# Patient Record
Sex: Female | Born: 1950 | Race: White | Hispanic: No | Marital: Married | Smoking: Never smoker
Health system: Southern US, Community
[De-identification: ages and names within clinical notes are randomized; demographics above are authoritative.]

## PROBLEM LIST (undated history)

## (undated) DIAGNOSIS — N393 Stress incontinence (female) (male): Secondary | ICD-10-CM

## (undated) DIAGNOSIS — N952 Postmenopausal atrophic vaginitis: Secondary | ICD-10-CM

## (undated) DIAGNOSIS — R112 Nausea with vomiting, unspecified: Secondary | ICD-10-CM

## (undated) DIAGNOSIS — E7800 Pure hypercholesterolemia, unspecified: Secondary | ICD-10-CM

## (undated) DIAGNOSIS — IMO0001 Reserved for inherently not codable concepts without codable children: Secondary | ICD-10-CM

## (undated) DIAGNOSIS — K2960 Other gastritis without bleeding: Secondary | ICD-10-CM

## (undated) DIAGNOSIS — Z9889 Other specified postprocedural states: Secondary | ICD-10-CM

## (undated) DIAGNOSIS — Z87440 Personal history of urinary (tract) infections: Secondary | ICD-10-CM

## (undated) DIAGNOSIS — E559 Vitamin D deficiency, unspecified: Secondary | ICD-10-CM

## (undated) DIAGNOSIS — D131 Benign neoplasm of stomach: Secondary | ICD-10-CM

## (undated) DIAGNOSIS — N3281 Overactive bladder: Secondary | ICD-10-CM

## (undated) DIAGNOSIS — H8109 Meniere's disease, unspecified ear: Secondary | ICD-10-CM

## (undated) DIAGNOSIS — R7303 Prediabetes: Secondary | ICD-10-CM

## (undated) DIAGNOSIS — H5500 Unspecified nystagmus: Secondary | ICD-10-CM

## (undated) DIAGNOSIS — N200 Calculus of kidney: Secondary | ICD-10-CM

## (undated) DIAGNOSIS — K219 Gastro-esophageal reflux disease without esophagitis: Secondary | ICD-10-CM

## (undated) DIAGNOSIS — M1990 Unspecified osteoarthritis, unspecified site: Secondary | ICD-10-CM

## (undated) DIAGNOSIS — E079 Disorder of thyroid, unspecified: Secondary | ICD-10-CM

## (undated) DIAGNOSIS — R42 Dizziness and giddiness: Secondary | ICD-10-CM

## (undated) HISTORY — PX: TONSILLECTOMY: SUR1361

## (undated) HISTORY — DX: Reserved for inherently not codable concepts without codable children: IMO0001

## (undated) HISTORY — DX: Meniere's disease, unspecified ear: H81.09

## (undated) HISTORY — DX: Overactive bladder: N32.81

## (undated) HISTORY — DX: Personal history of urinary (tract) infections: Z87.440

## (undated) HISTORY — DX: Postmenopausal atrophic vaginitis: N95.2

## (undated) HISTORY — PX: TUBAL LIGATION: SHX77

## (undated) HISTORY — DX: Stress incontinence (female) (male): N39.3

## (undated) HISTORY — DX: Calculus of kidney: N20.0

## (undated) HISTORY — DX: Vitamin D deficiency, unspecified: E55.9

## (undated) HISTORY — PX: OTHER SURGICAL HISTORY: SHX169

## (undated) HISTORY — PX: HIP ARTHROPLASTY: SHX981

---

## 1968-11-03 HISTORY — PX: BREAST EXCISIONAL BIOPSY: SUR124

## 2000-11-03 HISTORY — PX: ABDOMINAL HYSTERECTOMY: SHX81

## 2005-02-26 ENCOUNTER — Ambulatory Visit: Payer: Self-pay | Admitting: General Surgery

## 2005-03-20 ENCOUNTER — Ambulatory Visit: Payer: Self-pay | Admitting: General Surgery

## 2005-03-27 ENCOUNTER — Ambulatory Visit: Payer: Self-pay | Admitting: General Surgery

## 2006-06-18 ENCOUNTER — Ambulatory Visit: Payer: Self-pay | Admitting: Unknown Physician Specialty

## 2007-10-12 ENCOUNTER — Ambulatory Visit: Payer: Self-pay | Admitting: Internal Medicine

## 2007-11-04 HISTORY — PX: BREAST BIOPSY: SHX20

## 2008-08-14 ENCOUNTER — Ambulatory Visit: Payer: Self-pay | Admitting: Urology

## 2008-12-13 ENCOUNTER — Ambulatory Visit: Payer: Self-pay | Admitting: Internal Medicine

## 2009-12-18 ENCOUNTER — Ambulatory Visit: Payer: Self-pay | Admitting: Internal Medicine

## 2011-02-10 ENCOUNTER — Ambulatory Visit: Payer: Self-pay | Admitting: Internal Medicine

## 2012-04-13 ENCOUNTER — Ambulatory Visit: Payer: Self-pay | Admitting: Family Medicine

## 2012-04-15 ENCOUNTER — Ambulatory Visit: Payer: Self-pay | Admitting: Family Medicine

## 2012-06-30 ENCOUNTER — Ambulatory Visit: Payer: Self-pay | Admitting: Unknown Physician Specialty

## 2012-06-30 LAB — CREATININE, SERUM
EGFR (African American): >60
EGFR (Non-African Amer.): >60

## 2012-10-14 ENCOUNTER — Ambulatory Visit: Payer: Self-pay | Admitting: Gastroenterology

## 2012-10-18 ENCOUNTER — Ambulatory Visit: Payer: Self-pay | Admitting: Family Medicine

## 2013-03-30 DIAGNOSIS — M25559 Pain in unspecified hip: Secondary | ICD-10-CM | POA: Insufficient documentation

## 2013-03-30 DIAGNOSIS — M1610 Unilateral primary osteoarthritis, unspecified hip: Secondary | ICD-10-CM | POA: Insufficient documentation

## 2013-04-13 ENCOUNTER — Ambulatory Visit: Payer: Self-pay

## 2013-05-10 ENCOUNTER — Ambulatory Visit: Payer: Self-pay | Admitting: Family Medicine

## 2013-06-03 HISTORY — PX: JOINT REPLACEMENT: SHX530

## 2013-06-16 DIAGNOSIS — E039 Hypothyroidism, unspecified: Secondary | ICD-10-CM | POA: Insufficient documentation

## 2013-06-17 DIAGNOSIS — Z87440 Personal history of urinary (tract) infections: Secondary | ICD-10-CM | POA: Insufficient documentation

## 2014-03-01 ENCOUNTER — Ambulatory Visit: Payer: Self-pay | Admitting: Urology

## 2014-06-26 ENCOUNTER — Ambulatory Visit: Payer: Self-pay | Admitting: Family Medicine

## 2015-06-17 ENCOUNTER — Emergency Department
Admission: EM | Admit: 2015-06-17 | Discharge: 2015-06-18 | Disposition: A | Discharge status: Home / Self Care | Payer: Medicare Other | Attending: Emergency Medicine | Admitting: Emergency Medicine

## 2015-06-17 ENCOUNTER — Encounter: Payer: Self-pay | Admitting: *Deleted

## 2015-06-17 DIAGNOSIS — R42 Dizziness and giddiness: Secondary | ICD-10-CM | POA: Insufficient documentation

## 2015-06-17 DIAGNOSIS — H5500 Unspecified nystagmus: Secondary | ICD-10-CM | POA: Insufficient documentation

## 2015-06-17 DIAGNOSIS — Z79899 Other long term (current) drug therapy: Secondary | ICD-10-CM | POA: Insufficient documentation

## 2015-06-17 DIAGNOSIS — R112 Nausea with vomiting, unspecified: Secondary | ICD-10-CM | POA: Insufficient documentation

## 2015-06-17 HISTORY — DX: Dizziness and giddiness: R42

## 2015-06-17 HISTORY — DX: Unspecified nystagmus: H55.00

## 2015-06-17 LAB — COMPREHENSIVE METABOLIC PANEL
ALBUMIN: 4.3 g/dL (ref 3.5–5.0)
ALT: 59 U/L — AB (ref 14–54)
ANION GAP: 11 (ref 5–15)
AST: 46 U/L — ABNORMAL HIGH (ref 15–41)
Alkaline Phosphatase: 94 U/L (ref 38–126)
BILIRUBIN TOTAL: 0.4 mg/dL (ref 0.3–1.2)
BUN: 17 mg/dL (ref 6–20)
CO2: 27 mmol/L (ref 22–32)
CREATININE: 0.71 mg/dL (ref 0.44–1.00)
Calcium: 9.6 mg/dL (ref 8.9–10.3)
Chloride: 101 mmol/L (ref 101–111)
GLUCOSE: 159 mg/dL — AB (ref 65–99)
Potassium: 3.2 mmol/L — ABNORMAL LOW (ref 3.5–5.1)
Sodium: 139 mmol/L (ref 135–145)
TOTAL PROTEIN: 8.1 g/dL (ref 6.5–8.1)

## 2015-06-17 LAB — CBC
HEMATOCRIT: 38.2 % (ref 35.0–47.0)
Hemoglobin: 12.9 g/dL (ref 12.0–16.0)
MCH: 29.5 pg (ref 26.0–34.0)
MCHC: 33.8 g/dL (ref 32.0–36.0)
MCV: 87.3 fL (ref 80.0–100.0)
Platelets: 333 10*3/uL (ref 150–440)
RBC: 4.38 MIL/uL (ref 3.80–5.20)
RDW: 12.7 % (ref 11.5–14.5)
WBC: 8.9 10*3/uL (ref 3.6–11.0)

## 2015-06-17 MED ORDER — METOCLOPRAMIDE HCL 5 MG/ML IJ SOLN
INTRAMUSCULAR | Status: AC
  Administered 2015-06-17: 10 mg via INTRAVENOUS
  Filled 2015-06-17: qty 2

## 2015-06-17 MED ORDER — METOCLOPRAMIDE HCL 5 MG/ML IJ SOLN
10.0000 mg | Freq: Once | INTRAMUSCULAR | Status: AC
  Administered 2015-06-17: 10 mg via INTRAVENOUS

## 2015-06-17 MED ORDER — ONDANSETRON HCL 4 MG/2ML IJ SOLN: 4.0000 mg | Freq: Once | INTRAMUSCULAR | Status: DC | PRN

## 2015-06-17 MED ORDER — SODIUM CHLORIDE 0.9 % IV BOLUS (SEPSIS)
1000.0000 mL | Freq: Once | INTRAVENOUS | Status: AC
  Administered 2015-06-17: 1000 mL via INTRAVENOUS

## 2015-06-17 MED ORDER — DIAZEPAM 5 MG/ML IJ SOLN
2.5000 mg | Freq: Once | INTRAMUSCULAR | Status: AC
  Administered 2015-06-17: 2.5 mg via INTRAVENOUS
  Filled 2015-06-17: qty 2

## 2015-06-17 NOTE — ED Notes (Signed)
Family states possible infection of the labriniyth, possible Mine[place]

## 2015-06-17 NOTE — ED Notes (Signed)
Pt presents w/ c/o extreme, sudden onset nausea. Pt's husband states she has an inner ear infection. Pt denies pain in ears. Pt is dry heaving in triage and vomited several times in the past hour since onset at home. Pt has taken no medications for nausea.

## 2015-06-17 NOTE — ED Provider Notes (Signed)
[facility] Emergency Department Provider Note   ____________________________________________  Time seen: 2127  I have reviewed the triage vital signs and the nursing notes.   HISTORY  Chief Complaint Nausea   History limited by: Not Limited   HPI Susan Harding is a 64 y.o. female who presents to the emergency department today with fairly acute onset of dizziness, nausea and vomiting. 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 This however was completely different. The patient states that within roughly 30 minute time period she developed intense nausea, vomiting. She stated that slight movements would set it off and that it would persist. She has never had symptoms as intense or similar to this in the past. She contacted her ENT who recommended that she be seen in the emergency department and given antiemetics as well as benzodiazepines. No recent colds.  Past Medical History  Diagnosis Date  . Vertigo   . Nystagmus     There are no active problems to display for this patient.   Past Surgical History  Procedure Laterality Date  . Joint replacement    . Abdominal hysterectomy      Current Outpatient Rx  Name  Route  Sig  Dispense  Refill  . levothyroxine (SYNTHROID, LEVOTHROID) 100 MCG tablet   Oral   Take 100 mcg by mouth daily before breakfast.         . montelukast (SINGULAIR) 10 MG tablet   Oral   Take 10 mg by mouth at bedtime.         . triamterene-hydrochlorothiazide (DYAZIDE) 37.5-25 MG per capsule   Oral   Take 1 capsule by mouth daily.           Allergies Review of patient's allergies indicates no known allergies.  History reviewed. No pertinent family history.  Social History Social History  Substance Use Topics  . Smoking status: Never Smoker   . Smokeless tobacco: Never Used  . Alcohol Use: No    Review of Systems  Constitutional: Negative for fever. Cardiovascular: Negative for chest pain. Respiratory:  Negative for shortness of breath. Gastrointestinal: Positive for nausea and vomiting Neurological: Positive for dizziness.  10-point ROS otherwise negative.  ____________________________________________   PHYSICAL EXAM:  VITAL SIGNS: ED Triage Vitals  Enc Vitals Group     BP 06/17/15 2105 127/71 mmHg     Pulse Rate 06/17/15 2105 67     Resp 06/17/15 2105 20     Temp 06/17/15 2105 97.9 F (36.6 C)     Temp Source 06/17/15 2105 Oral     SpO2 06/17/15 2105 96 %     Weight 06/17/15 2105 165 lb (74.844 kg)     Height 06/17/15 2105 5\' 2"  (1.575 m)   Constitutional: Alert and oriented. Appears uncomfortable. Eyes: Conjunctivae are normal. PERRL. bilateral nystagmus. ENT   Head: Normocephalic and atraumatic.   Nose: No congestion/rhinnorhea.   Mouth/Throat: Mucous membranes are moist.   Neck: No stridor. Hematological/Lymphatic/Immunilogical: No cervical lymphadenopathy. Cardiovascular: Normal rate, regular rhythm.  No murmurs, rubs, or gallops. Respiratory: Normal respiratory effort without tachypnea nor retractions. Breath sounds are clear and equal bilaterally. No wheezes/rales/rhonchi. Genitourinary: Deferred Musculoskeletal: Normal range of motion in all extremities. No joint effusions.  No lower extremity tenderness nor edema. Neurologic:  Normal speech and language. Bilateral nystagmus. Extraocular motions intact. No gross focal neurologic deficits are appreciated. Speech is normal.  Skin:  Skin is warm, dry and intact. No rash noted. Psychiatric: Mood and affect are normal. Speech  and behavior are normal. Patient exhibits appropriate insight and judgment.  ____________________________________________    LABS (pertinent positives/negatives)  Labs Reviewed  COMPREHENSIVE METABOLIC PANEL - Abnormal; Notable for the following:    Potassium 3.2 (*)    Glucose, Bld 159 (*)    AST 46 (*)    ALT 59 (*)    All other components within normal limits  CBC      ____________________________________________   EKG  None  ____________________________________________    RADIOLOGY  None  ____________________________________________   PROCEDURES  Procedure(s) performed: None  Critical Care performed: No  ____________________________________________   INITIAL IMPRESSION / ASSESSMENT AND PLAN / ED COURSE  Pertinent labs & imaging results that were available during my care of the patient were reviewed by me and considered in my medical decision making (see chart for details).  Patient presented to the emergency department today because of concerns for somewhat acute onset of nausea and vomiting and dizziness. On exam patient does have some bilateral nystagmus and does appear uncomfortable with dry heaving. I discussed with Dr. Kathyrn Sheriff with ENT who believes this is likely acute labyrinthitis. Will give both antiemetics and benzo and reassess.  Patient did feel better after medication. Was able to tolerate getting up and walking around. Will plan on discharging home with medications and ENT follow up.  ____________________________________________   FINAL CLINICAL IMPRESSION(S) / ED DIAGNOSES  Final diagnoses:  Dizziness  Nausea and vomiting, vomiting of unspecified type     Nance Pear, MD 06/18/15 [phone]

## 2015-06-18 MED ORDER — DIAZEPAM 5 MG PO TABS: 5.0000 mg | ORAL_TABLET | Freq: Three times a day (TID) | ORAL | Status: AC | PRN

## 2015-06-18 MED ORDER — METOCLOPRAMIDE HCL 10 MG PO TABS: 10.0000 mg | ORAL_TABLET | Freq: Four times a day (QID) | ORAL | Status: DC | PRN

## 2015-06-18 NOTE — ED Notes (Signed)
Pt. Going home with husband. 

## 2015-06-18 NOTE — Discharge Instructions (Signed)
Please follow up with Dr. Kathyrn Sheriff. Please seek medical attention for any high fevers, chest pain, shortness of breath, change in behavior, persistent vomiting, bloody stool or any other new or concerning symptoms.  Labyrinthitis (Inner Ear Inflammation) Your exam shows you have an inner ear disturbance or labyrinthitis. The cause of this condition is not known. But it may be due to a virus infection. The symptoms of labyrinthitis include vertigo or dizziness made worse by motion, nausea and vomiting. The onset of labyrinthitis may be very sudden. It usually lasts for a few days and then clears up over 1-2 weeks. The treatment of an inner ear disturbance includes bed rest and medications to reduce dizziness, nausea, and vomiting. You should stay away from alcohol, tranquilizers, caffeine, nicotine, or any medicine your doctor thinks may make your symptoms worse. Further testing may be needed to evaluate your hearing and balance system. Please see your doctor or go to the emergency room right away if you have:  Increasing vertigo, earache, loss of hearing, or ear drainage.  Headache, blurred vision, trouble walking, fainting, or fever.  Persistent vomiting, dehydration, or extreme weakness. Document Released: 10/20/2005 Document Revised: 01/12/2012 Document Reviewed: 04/07/2007 [facility] Patient Information 2015 [place], [place]. This information is not intended to replace advice given to you by your health care provider. Make sure you discuss any questions you have with your health care provider.

## 2015-08-16 ENCOUNTER — Encounter: Payer: Self-pay | Admitting: *Deleted

## 2015-08-28 ENCOUNTER — Ambulatory Visit: Payer: Self-pay | Admitting: Urology

## 2015-08-29 ENCOUNTER — Ambulatory Visit (INDEPENDENT_AMBULATORY_CARE_PROVIDER_SITE_OTHER): Payer: Medicare Other | Admitting: Urology

## 2015-08-29 ENCOUNTER — Encounter: Payer: Self-pay | Admitting: Urology

## 2015-08-29 ENCOUNTER — Telehealth: Payer: Self-pay | Admitting: Urology

## 2015-08-29 VITALS — BP 120/80 | HR 71 | Ht 62.0 in | Wt 170.6 lb

## 2015-08-29 DIAGNOSIS — N952 Postmenopausal atrophic vaginitis: Secondary | ICD-10-CM | POA: Diagnosis not present

## 2015-08-29 DIAGNOSIS — N3281 Overactive bladder: Secondary | ICD-10-CM

## 2015-08-29 LAB — MICROSCOPIC EXAMINATION
Bacteria, UA: NONE SEEN
RENAL EPITHEL UA: NONE SEEN /HPF

## 2015-08-29 LAB — URINALYSIS, COMPLETE
BILIRUBIN UA: NEGATIVE
Glucose, UA: NEGATIVE
KETONES UA: NEGATIVE
NITRITE UA: NEGATIVE
Protein, UA: NEGATIVE
SPEC GRAV UA: 1.015 (ref 1.005–1.030)
UUROB: 0.2 mg/dL (ref 0.2–1.0)
pH, UA: 6.5 (ref 5.0–7.5)

## 2015-08-29 NOTE — Progress Notes (Signed)
08/29/2015 10:16 AM   Susan Harding 1951/07/31 [id number]  Referring provider: No referring provider defined for this encounter.  CC:   OAB follow up  HPI: Patient is a 64 year old white female with urinary urgency and  has atrophic vaginitis who presents today for her 1 year follow-up.  Patient has completely eliminated caffeine from her diet and her urinary symptoms have completely abated.  Her UA today is unremarkable.  She is not had any urinary tract infections of the last year. She denies any dysuria, suprapubic pain or hematuria. She also has not had any fevers, chills, nausea or vomiting.  She is using her vaginal estrogen cream intermittently. She is having some frictional pain during intercourse, but this is not a frequent issue.  She would like a refill for the cream sent to the compounding pharmacy.  PMH: Past Medical History  Diagnosis Date  . Vertigo   . Nystagmus   . Renal stones   . Atrophic vaginitis   . Frequency   . OAB (overactive bladder)   . History of bladder infections   . Stress incontinence   . Vitamin D deficiency   . Meniere disease     Surgical History: Past Surgical History  Procedure Laterality Date  . Joint replacement  06/2013    hip  . Abdominal hysterectomy  2002    Home Medications:    Medication List       This list is accurate as of: 08/29/15 10:16 AM.  Always use your most recent med list.               diazepam 5 MG tablet  Commonly known as:  VALIUM  Take 1 tablet (5 mg total) by mouth every 8 (eight) hours as needed (Dizziness/Vertigo).     levothyroxine 100 MCG tablet  Commonly known as:  SYNTHROID, LEVOTHROID  Take 100 mcg by mouth daily before breakfast.     metoCLOPramide 10 MG tablet  Commonly known as:  REGLAN  Take 1 tablet (10 mg total) by mouth every 6 (six) hours as needed for nausea or vomiting.     montelukast 10 MG tablet  Commonly known as:  SINGULAIR  Take 10 mg by mouth at bedtime.     triamterene-hydrochlorothiazide 37.5-25 MG capsule  Commonly known as:  DYAZIDE  Take 1 capsule by mouth daily.        Allergies: No Known Allergies  Family History: Family History  Problem Relation Age of Onset  . Interstitial cystitis Daughter   . Kidney disease Father   . Prostate cancer Neg Hx     Social History:  reports that she has never smoked. She has never used smokeless tobacco. She reports that she does not drink alcohol or use illicit drugs.  ROS: UROLOGY Frequent Urination?: No Hard to postpone urination?: No Burning/pain with urination?: No Get up at night to urinate?: No Leakage of urine?: No Urine stream starts and stops?: No Trouble starting stream?: No Do you have to strain to urinate?: No Blood in urine?: No Urinary tract infection?: No Sexually transmitted disease?: No Injury to kidneys or bladder?: No Painful intercourse?: No Weak stream?: No Currently pregnant?: No Vaginal bleeding?: No Last menstrual period?: n  Gastrointestinal Nausea?: No Vomiting?: No Indigestion/heartburn?: No Diarrhea?: No Constipation?: No  Constitutional Fever: No Night sweats?: No Weight loss?: No Fatigue?: No  Skin Skin rash/lesions?: No Itching?: No  Eyes Blurred vision?: No Double vision?: No  Ears/Nose/Throat Sore throat?: No Sinus problems?: No  Hematologic/Lymphatic Swollen glands?: No Easy bruising?: No  Cardiovascular Leg swelling?: No Chest pain?: No  Respiratory Cough?: No Shortness of breath?: No  Endocrine Excessive thirst?: No  Musculoskeletal Back pain?: No Joint pain?: No  Neurological Headaches?: No Dizziness?: No  Psychologic Depression?: No Anxiety?: No  Physical Exam: BP 120/80 mmHg  Pulse 71  Ht 5\' 2"  (1.575 m)  Wt 170 lb 9.6 oz (77.384 kg)  BMI 31.20 kg/m2   Laboratory Data: Lab Results  Component Value Date   WBC 8.9 06/17/2015   HGB 12.9 06/17/2015   HCT 38.2 06/17/2015   MCV 87.3 06/17/2015     PLT 333 06/17/2015    Lab Results  Component Value Date   CREATININE 0.71 06/17/2015    Urinalysis Results for orders placed or performed in visit on 08/29/15  Microscopic Examination  Result Value Ref Range   WBC, UA 0-5 0 -  5 /hpf   RBC, UA 0-2 0 -  2 /hpf   Epithelial Cells (non renal) 0-10 0 - 10 /hpf   Renal Epithel, UA None seen None seen /hpf   Bacteria, UA None seen None seen/Few  Urinalysis, Complete  Result Value Ref Range   Specific Gravity, UA 1.015 1.005 - 1.030   pH, UA 6.5 5.0 - 7.5   Color, UA Yellow Yellow   Appearance Ur Clear Clear   Leukocytes, UA Trace (A) Negative   Protein, UA Negative Negative/Trace   Glucose, UA Negative Negative   Ketones, UA Negative Negative   RBC, UA Trace (A) Negative   Bilirubin, UA Negative Negative   Urobilinogen, Ur 0.2 0.2 - 1.0 mg/dL   Nitrite, UA Negative Negative   Microscopic Examination See below:      Assessment & Plan:    1. OAB (overactive bladder):    Patient is found that eliminated caffeine completely from her diet completely relieves her of her overactive bladder symptoms and urinary tract infections.  She states that avoiding caffeine-containing products is worth not having her overactive bladder symptoms.  Her UA today is negative.  Patient would like to continue her care with her primary care physician. She will contact our office if her urinary symptoms should return.  - Urinalysis, Complete  2. Atrophic vaginitis:   A refill for the compounded vaginal estrogen cream is sent to [facility].  She will ask her primary care physician if he feels comfortable refilling her vaginal estrogen cream.     Return if symptoms worsen or fail to improve.  Zara Council, [facility] [address] [place], [place] [address] [phone]

## 2015-08-29 NOTE — Telephone Encounter (Signed)
Would you call in the compounded vaginal estrogen cream to [facility]?

## 2015-08-30 NOTE — Telephone Encounter (Signed)
Medication called [facility].

## 2015-10-12 ENCOUNTER — Other Ambulatory Visit: Payer: Self-pay | Admitting: Gastroenterology

## 2015-10-12 DIAGNOSIS — R1310 Dysphagia, unspecified: Secondary | ICD-10-CM

## 2015-10-12 DIAGNOSIS — R1013 Epigastric pain: Secondary | ICD-10-CM

## 2015-10-19 ENCOUNTER — Ambulatory Visit
Admission: RE | Admit: 2015-10-19 | Discharge: 2015-10-19 | Disposition: A | Discharge status: Home / Self Care | Payer: Medicare Other | Source: Ambulatory Visit | Attending: Gastroenterology | Admitting: Gastroenterology

## 2015-10-19 DIAGNOSIS — R1013 Epigastric pain: Secondary | ICD-10-CM | POA: Insufficient documentation

## 2015-10-19 DIAGNOSIS — N281 Cyst of kidney, acquired: Secondary | ICD-10-CM | POA: Diagnosis not present

## 2015-10-19 DIAGNOSIS — R1310 Dysphagia, unspecified: Secondary | ICD-10-CM

## 2015-10-24 ENCOUNTER — Other Ambulatory Visit: Payer: Self-pay | Admitting: Gastroenterology

## 2015-10-24 DIAGNOSIS — N281 Cyst of kidney, acquired: Secondary | ICD-10-CM

## 2015-10-24 DIAGNOSIS — K769 Liver disease, unspecified: Secondary | ICD-10-CM

## 2015-11-15 ENCOUNTER — Ambulatory Visit
Admission: RE | Admit: 2015-11-15 | Discharge: 2015-11-15 | Disposition: A | Discharge status: Home / Self Care | Payer: Medicare Other | Source: Ambulatory Visit | Attending: Gastroenterology | Admitting: Gastroenterology

## 2015-11-15 ENCOUNTER — Encounter: Payer: Self-pay | Admitting: *Deleted

## 2015-11-15 DIAGNOSIS — K769 Liver disease, unspecified: Secondary | ICD-10-CM

## 2015-11-15 DIAGNOSIS — N281 Cyst of kidney, acquired: Secondary | ICD-10-CM | POA: Diagnosis present

## 2015-11-15 DIAGNOSIS — K7689 Other specified diseases of liver: Secondary | ICD-10-CM | POA: Insufficient documentation

## 2015-11-15 DIAGNOSIS — K824 Cholesterolosis of gallbladder: Secondary | ICD-10-CM | POA: Insufficient documentation

## 2015-11-15 MED ORDER — GADOBENATE DIMEGLUMINE 529 MG/ML IV SOLN
15.0000 mL | Freq: Once | INTRAVENOUS | Status: AC | PRN
  Administered 2015-11-15: 15 mL via INTRAVENOUS

## 2015-11-16 ENCOUNTER — Ambulatory Visit: Payer: Medicare Other | Admitting: Anesthesiology

## 2015-11-16 ENCOUNTER — Encounter
Admission: RE | Disposition: A | Discharge status: Home / Self Care | Payer: Self-pay | Source: Ambulatory Visit | Attending: Gastroenterology

## 2015-11-16 ENCOUNTER — Ambulatory Visit
Admission: RE | Admit: 2015-11-16 | Discharge: 2015-11-16 | Disposition: A | Discharge status: Home / Self Care | Payer: Medicare Other | Source: Ambulatory Visit | Attending: Gastroenterology | Admitting: Gastroenterology

## 2015-11-16 DIAGNOSIS — K2950 Unspecified chronic gastritis without bleeding: Secondary | ICD-10-CM | POA: Diagnosis not present

## 2015-11-16 DIAGNOSIS — E559 Vitamin D deficiency, unspecified: Secondary | ICD-10-CM | POA: Diagnosis not present

## 2015-11-16 DIAGNOSIS — K2980 Duodenitis without bleeding: Secondary | ICD-10-CM | POA: Diagnosis not present

## 2015-11-16 DIAGNOSIS — E119 Type 2 diabetes mellitus without complications: Secondary | ICD-10-CM | POA: Insufficient documentation

## 2015-11-16 DIAGNOSIS — G709 Myoneural disorder, unspecified: Secondary | ICD-10-CM | POA: Diagnosis not present

## 2015-11-16 DIAGNOSIS — R1310 Dysphagia, unspecified: Secondary | ICD-10-CM | POA: Diagnosis not present

## 2015-11-16 DIAGNOSIS — K317 Polyp of stomach and duodenum: Secondary | ICD-10-CM | POA: Diagnosis not present

## 2015-11-16 DIAGNOSIS — K228 Other specified diseases of esophagus: Secondary | ICD-10-CM | POA: Diagnosis not present

## 2015-11-16 DIAGNOSIS — Z79899 Other long term (current) drug therapy: Secondary | ICD-10-CM | POA: Insufficient documentation

## 2015-11-16 DIAGNOSIS — Z885 Allergy status to narcotic agent status: Secondary | ICD-10-CM | POA: Insufficient documentation

## 2015-11-16 DIAGNOSIS — H8109 Meniere's disease, unspecified ear: Secondary | ICD-10-CM | POA: Insufficient documentation

## 2015-11-16 DIAGNOSIS — R1013 Epigastric pain: Secondary | ICD-10-CM | POA: Insufficient documentation

## 2015-11-16 DIAGNOSIS — K2960 Other gastritis without bleeding: Secondary | ICD-10-CM | POA: Diagnosis not present

## 2015-11-16 HISTORY — DX: Unspecified osteoarthritis, unspecified site: M19.90

## 2015-11-16 HISTORY — PX: ESOPHAGOGASTRODUODENOSCOPY: SHX5428

## 2015-11-16 SURGERY — EGD (ESOPHAGOGASTRODUODENOSCOPY)
Anesthesia: General

## 2015-11-16 MED ORDER — PROPOFOL 500 MG/50ML IV EMUL
INTRAVENOUS | Status: DC | PRN
  Administered 2015-11-16: 120 ug/kg/min via INTRAVENOUS

## 2015-11-16 MED ORDER — SODIUM CHLORIDE 0.9 % IV SOLN: INTRAVENOUS | Status: DC

## 2015-11-16 MED ORDER — MIDAZOLAM HCL 2 MG/2ML IJ SOLN
INTRAMUSCULAR | Status: DC | PRN
  Administered 2015-11-16 (×2): 1 mg via INTRAVENOUS

## 2015-11-16 MED ORDER — SODIUM CHLORIDE 0.9 % IV SOLN
INTRAVENOUS | Status: DC
  Administered 2015-11-16: 1000 mL via INTRAVENOUS

## 2015-11-16 MED ORDER — AMPICILLIN SODIUM 2 G IJ SOLR
2.0000 g | Freq: Once | INTRAMUSCULAR | Status: AC
  Administered 2015-11-16: 2 g via INTRAVENOUS
  Filled 2015-11-16: qty 2000

## 2015-11-16 MED ORDER — FENTANYL CITRATE (PF) 100 MCG/2ML IJ SOLN
INTRAMUSCULAR | Status: DC | PRN
  Administered 2015-11-16 (×2): 25 ug via INTRAVENOUS

## 2015-11-16 MED ORDER — PROPOFOL 10 MG/ML IV BOLUS
INTRAVENOUS | Status: DC | PRN
  Administered 2015-11-16 (×2): 20 mg via INTRAVENOUS

## 2015-11-16 MED ORDER — GLYCOPYRROLATE 0.2 MG/ML IJ SOLN
INTRAMUSCULAR | Status: DC | PRN
  Administered 2015-11-16: 0.2 mg via INTRAVENOUS

## 2015-11-16 MED ORDER — LIDOCAINE HCL (CARDIAC) 20 MG/ML IV SOLN
INTRAVENOUS | Status: DC | PRN
  Administered 2015-11-16: 100 mg via INTRAVENOUS

## 2015-11-16 NOTE — Op Note (Signed)
[facility] Gastroenterology Patient Name: Susan Harding Procedure Date: 11/16/2015 8:59 AM MRN: [id number] Account #: [id number] Date of Birth: 1950-12-21 Admit Type: Outpatient Age: 65 Room: [facility] ENDO ROOM 3 Gender: Female Note Status: Finalized Procedure:         Upper GI endoscopy Indications:       Epigastric abdominal pain, Dysphagia Providers:         Lollie Sails, MD Referring MD:      Dion Body (Referring MD) Medicines:         Monitored Anesthesia Care Complications:     No immediate complications. Procedure:         Pre-Anesthesia Assessment:                    - ASA Grade Assessment: II - A patient with mild systemic                     disease.                    After obtaining informed consent, the endoscope was passed                     under direct vision. Throughout the procedure, the                     patient's blood pressure, pulse, and oxygen saturations                     were monitored continuously. The Endoscope was introduced                     through the mouth, and advanced to the third part of                     duodenum. The upper GI endoscopy was accomplished without                     difficulty. The patient tolerated the procedure well. Findings:      The Z-line was irregular. Biopsies were taken with a cold forceps for       histology.      The exam of the esophagus was otherwise normal.      Patchy mild inflammation characterized by congestion (edema), erosions       and granularity was found at the incisura, in the gastric antrum and in       the prepyloric region of the stomach. Biopsies were taken with a cold       forceps for histology. Biopsies were taken with a cold forceps for       Helicobacter pylori testing.      The cardia and gastric fundus were normal on retroflexion.      A single 1 mm sessile polyp with no bleeding and no stigmata of recent       bleeding was found in the gastric body. The  polyp was removed with a       cold biopsy forceps. Resection and retrieval were complete.      The cardia and gastric fundus were normal on retroflexion.      Patchy mild inflammation characterized by granularity was found in the       duodenal bulb. Biopsies were taken with a cold forceps for histology. Impression:        - Z-line irregular. Biopsied.                    -  Erosive gastritis. Biopsied.                    - A single gastric polyp. Resected and retrieved.                    - Duodenitis. Biopsied. Recommendation:    - Await pathology results.                    - Use Prilosec (omeprazole) 20 mg PO BID for 1 month.                    - Use Prilosec (omeprazole) 20 mg PO daily daily. Procedure Code(s): --- Professional ---                    [phone], Esophagogastroduodenoscopy, flexible, transoral;                     with biopsy, single or multiple Diagnosis Code(s): --- Professional ---                    K22.8, Other specified diseases of esophagus                    K29.60, Other gastritis without bleeding                    K31.7, Polyp of stomach and duodenum                    K29.80, Duodenitis without bleeding                    R10.13, Epigastric pain                    R13.10, Dysphagia, unspecified CPT copyright 2014 American Medical Association. All rights reserved. The codes documented in this report are preliminary and upon coder review may  be revised to meet current compliance requirements. Lollie Sails, MD 11/16/2015 9:25:52 AM This report has been signed electronically. Number of Addenda: 0 Note Initiated On: 11/16/2015 8:59 AM      [facility]

## 2015-11-16 NOTE — H&P (Signed)
Outpatient short stay form Pre-procedure 11/16/2015 8:57 AM Lollie Sails MD  Primary Physician: Dr Barbaraann Cao  Reason for visit:  EGD  History of present illness:  Patient is a 65 year old female presenting today for EGD. Several weeks ago she had some problems with odynophagia and dysphagia with epigastric and distal retrosternal chest discomfort. He was started on some omeprazole currently states she is doing quite well. She had a barium swallow showing a small hiatal hernia. Never been on a proton pump inhibitor before.  She takes no blood thinning medications /aspirin products.    Current facility-administered medications:  .  0.9 %  sodium chloride infusion, , Intravenous, Continuous, Lollie Sails, MD, Last Rate: 20 mL/hr at 11/16/15 0805, 1,000 mL at 11/16/15 0805 .  0.9 %  sodium chloride infusion, , Intravenous, Continuous, Lollie Sails, MD  Prescriptions prior to admission  Medication Sig Dispense Refill Last Dose  . Calcium Carbonate-Vitamin D 600-400 MG-UNIT tablet Take 2 tablets by mouth daily.   Past Week at Unknown time  . diazepam (VALIUM) 5 MG tablet Take 1 tablet (5 mg total) by mouth every 8 (eight) hours as needed (Dizziness/Vertigo). 30 tablet 0 Past Week at Unknown time  . estradiol (ESTRACE) 0.1 MG/GM vaginal cream Place 1 Applicatorful vaginally every 14 (fourteen) days.   Past Week at Unknown time  . Lactobacillus Acidophilus POWD 1 tablet by Does not apply route daily.   Past Week at Unknown time  . levothyroxine (SYNTHROID, LEVOTHROID) 100 MCG tablet Take 100 mcg by mouth daily before breakfast.   11/15/2015 at Unknown time  . Melatonin 10 MG TABS Take 1 tablet by mouth at bedtime.   Past Week at Unknown time  . metoCLOPramide (REGLAN) 10 MG tablet Take 1 tablet (10 mg total) by mouth every 6 (six) hours as needed for nausea or vomiting. 30 tablet 0 Past Week at Unknown time  . montelukast (SINGULAIR) 10 MG tablet Take 10 mg by mouth at bedtime.    Past Week at Unknown time  . OMEGA 3-6-9 FATTY ACIDS PO Take 1 capsule by mouth daily.   Past Week at Unknown time  . omeprazole (PRILOSEC) 20 MG capsule Take 20 mg by mouth 2 (two) times daily before a meal.   Past Week at Unknown time  . triamterene-hydrochlorothiazide (DYAZIDE) 37.5-25 MG per capsule Take 1 capsule by mouth daily.   Past Week at Unknown time  . trimethoprim (TRIMPEX) 100 MG tablet Take 100 mg by mouth daily.   11/15/2015 at Unknown time     Allergies  Allergen Reactions  . Codeine      Past Medical History  Diagnosis Date  . Vertigo   . Nystagmus   . Renal stones   . Atrophic vaginitis   . Frequency   . OAB (overactive bladder)   . History of bladder infections   . Stress incontinence   . Vitamin D deficiency   . Meniere disease   . Arthritis   . Diabetes mellitus without complication ([place])     Borderline Diabetes (diet controlled)    Review of systems:      Physical Exam    Heart and lungs: Regular rate and rhythm without rub, lungs are bilaterally clear.    HEENT: Normal cephalic atraumatic eyes are anicteric    Other:     Pertinant exam for procedure: Soft nontender nondistended bowel sounds positive normoactive.    Planned proceedures: EGD and indicated procedures. I have discussed the risks benefits  and complications of procedures to include not limited to bleeding, infection, perforation and the risk of sedation and the patient wishes to proceed.    Lollie Sails, MD Gastroenterology 11/16/2015  8:57 AM

## 2015-11-16 NOTE — Anesthesia Preprocedure Evaluation (Signed)
Anesthesia Evaluation  Patient identified by MRN, date of birth, ID band Patient awake    Reviewed: Allergy & Precautions, H&P , NPO status , Patient's Chart, lab work & pertinent test results, reviewed documented beta blocker date and time   Airway Mallampati: II  TM Distance: >3 FB Neck ROM: full    Dental no notable dental hx.    Pulmonary neg pulmonary ROS,    Pulmonary exam normal breath sounds clear to auscultation       Cardiovascular Exercise Tolerance: Good negative cardio ROS   Rhythm:regular Rate:Normal     Neuro/Psych  Neuromuscular disease negative neurological ROS  negative psych ROS   GI/Hepatic negative GI ROS, Neg liver ROS,   Endo/Other  negative endocrine ROSdiabetes  Renal/GU negative Renal ROS  negative genitourinary   Musculoskeletal   Abdominal   Peds  Hematology negative hematology ROS (+)   Anesthesia Other Findings   Reproductive/Obstetrics negative OB ROS                             Anesthesia Physical Anesthesia Plan  ASA: II  Anesthesia Plan: General   Post-op Pain Management:    Induction:   Airway Management Planned:   Additional Equipment:   Intra-op Plan:   Post-operative Plan:   Informed Consent: I have reviewed the patients History and Physical, chart, labs and discussed the procedure including the risks, benefits and alternatives for the proposed anesthesia with the patient or authorized representative who has indicated his/her understanding and acceptance.   Dental Advisory Given  Plan Discussed with: CRNA  Anesthesia Plan Comments:         Anesthesia Quick Evaluation

## 2015-11-16 NOTE — Transfer of Care (Signed)
Immediate Anesthesia Transfer of Care Note  Patient: [name]  Procedure(s) Performed: Procedure(s): ESOPHAGOGASTRODUODENOSCOPY (EGD) (N/A)  Patient Location: PACU  Anesthesia Type:General  Level of Consciousness: sedated  Airway & Oxygen Therapy: Patient Spontanous Breathing and Patient connected to nasal cannula oxygen  Post-op Assessment: Report given to RN and Post -op Vital signs reviewed and stable  Post vital signs: Reviewed and stable  Last Vitals:  Filed Vitals:   11/16/15 0749  BP: 127/86  Pulse: 80  Temp: 36.8 C  Resp: 16    Complications: No apparent anesthesia complications

## 2015-11-16 NOTE — Anesthesia Procedure Notes (Signed)
Date/Time: 11/16/2015 9:01 AM Performed by: Johnna Acosta Pre-anesthesia Checklist: Patient identified, Emergency Drugs available, Suction available and Patient being monitored Patient Re-evaluated:Patient Re-evaluated prior to inductionOxygen Delivery Method: Nasal cannula

## 2015-11-18 NOTE — Anesthesia Postprocedure Evaluation (Signed)
Anesthesia Post Note  Patient: Susan Harding  Procedure(s) Performed: Procedure(s) (LRB): ESOPHAGOGASTRODUODENOSCOPY (EGD) (N/A)  Patient location during evaluation: PACU Anesthesia Type: General Level of consciousness: awake and alert Pain management: pain level controlled Vital Signs Assessment: post-procedure vital signs reviewed and stable Respiratory status: spontaneous breathing, nonlabored ventilation, respiratory function stable and patient connected to nasal cannula oxygen Cardiovascular status: blood pressure returned to baseline and stable Postop Assessment: no signs of nausea or vomiting Anesthetic complications: no    Last Vitals:  Filed Vitals:   11/16/15 0950 11/16/15 1000  BP: 115/76 106/71  Pulse: 79 75  Temp:    Resp: 17 11    Last Pain:  Filed Vitals:   11/17/15 1601  PainSc: 0-No pain                 Molli Barrows

## 2015-11-20 LAB — SURGICAL PATHOLOGY

## 2015-11-21 ENCOUNTER — Encounter: Payer: Self-pay | Admitting: Gastroenterology

## 2015-11-21 ENCOUNTER — Other Ambulatory Visit: Payer: Self-pay | Admitting: Family Medicine

## 2015-11-21 DIAGNOSIS — Z1231 Encounter for screening mammogram for malignant neoplasm of breast: Secondary | ICD-10-CM

## 2015-11-29 ENCOUNTER — Ambulatory Visit: Payer: Medicare Other | Attending: Family Medicine

## 2015-12-05 ENCOUNTER — Ambulatory Visit
Admission: RE | Admit: 2015-12-05 | Discharge: 2015-12-05 | Disposition: A | Discharge status: Home / Self Care | Payer: Medicare Other | Source: Ambulatory Visit | Attending: Family Medicine | Admitting: Family Medicine

## 2015-12-05 ENCOUNTER — Other Ambulatory Visit: Payer: Self-pay | Admitting: Family Medicine

## 2015-12-05 DIAGNOSIS — Z1231 Encounter for screening mammogram for malignant neoplasm of breast: Secondary | ICD-10-CM

## 2016-10-20 ENCOUNTER — Other Ambulatory Visit: Payer: Self-pay | Admitting: Gastroenterology

## 2016-10-20 DIAGNOSIS — R1013 Epigastric pain: Secondary | ICD-10-CM

## 2016-11-03 HISTORY — PX: HIP ARTHROPLASTY: SHX981

## 2016-11-19 ENCOUNTER — Ambulatory Visit: Payer: Medicare Other

## 2016-11-25 ENCOUNTER — Ambulatory Visit
Admission: RE | Admit: 2016-11-25 | Discharge: 2016-11-25 | Disposition: A | Discharge status: Home / Self Care | Payer: Medicare Other | Source: Ambulatory Visit | Attending: Gastroenterology | Admitting: Gastroenterology

## 2016-11-25 DIAGNOSIS — N281 Cyst of kidney, acquired: Secondary | ICD-10-CM | POA: Diagnosis not present

## 2016-11-25 DIAGNOSIS — K824 Cholesterolosis of gallbladder: Secondary | ICD-10-CM | POA: Insufficient documentation

## 2016-11-25 DIAGNOSIS — R1013 Epigastric pain: Secondary | ICD-10-CM | POA: Diagnosis not present

## 2016-12-29 ENCOUNTER — Encounter: Payer: Self-pay | Admitting: Urology

## 2016-12-29 ENCOUNTER — Ambulatory Visit (INDEPENDENT_AMBULATORY_CARE_PROVIDER_SITE_OTHER): Payer: Medicare Other | Admitting: Urology

## 2016-12-29 VITALS — Ht 62.0 in | Wt 168.1 lb

## 2016-12-29 DIAGNOSIS — R3129 Other microscopic hematuria: Secondary | ICD-10-CM | POA: Diagnosis not present

## 2016-12-29 DIAGNOSIS — N952 Postmenopausal atrophic vaginitis: Secondary | ICD-10-CM | POA: Diagnosis not present

## 2016-12-29 DIAGNOSIS — R3915 Urgency of urination: Secondary | ICD-10-CM

## 2016-12-29 DIAGNOSIS — N390 Urinary tract infection, site not specified: Secondary | ICD-10-CM | POA: Diagnosis not present

## 2016-12-29 LAB — URINALYSIS, COMPLETE
Bilirubin, UA: NEGATIVE
Glucose, UA: NEGATIVE
Ketones, UA: NEGATIVE
LEUKOCYTES UA: NEGATIVE
Nitrite, UA: NEGATIVE
PH UA: 6.5 (ref 5.0–7.5)
Protein, UA: NEGATIVE
RBC, UA: NEGATIVE
SPEC GRAV UA: 1.01 (ref 1.005–1.030)
Urobilinogen, Ur: 0.2 mg/dL (ref 0.2–1.0)

## 2016-12-29 LAB — MICROSCOPIC EXAMINATION

## 2016-12-29 LAB — BLADDER SCAN AMB NON-IMAGING: Scan Result: 56

## 2016-12-29 MED ORDER — ESTROGENS, CONJUGATED 0.625 MG/GM VA CREA: 1.0000 | TOPICAL_CREAM | Freq: Every day | VAGINAL | 12 refills | Status: DC

## 2016-12-29 MED ORDER — ESTRADIOL 0.1 MG/GM VA CREA: TOPICAL_CREAM | VAGINAL | 12 refills | Status: DC

## 2016-12-29 MED ORDER — TRIMETHOPRIM 100 MG PO TABS: ORAL_TABLET | ORAL | 0 refills | Status: DC

## 2016-12-29 NOTE — Progress Notes (Signed)
12/29/2016 10:49 AM   Susan [place] 10-Aug-1951 [id number]  Referring provider: Dion Body, MD [place], [place] [address]  CC:   OAB follow up  HPI: 66 yo WF who presents today requesting an urgent appointment for urinary urgency and discomfort.  She stated that two weeks ago she started to experience dysuria, foul smelling urine and suprapubic discomfort.  She denied fevers, chills, nausea and vomiting.  She had Cipro on hand so she took three days of the antibiotic.  Her symptoms did not improve, so she took the Cipro for five more days.  Her symptoms did not really improve until three days ago.  She was going to cancel the appointment, but she was still experiencing suprapubic discomfort.    Today, she complains of urgency x 3, frequency x 8, toilet mapping, incontinence x 3 and nocturia x 3.   She is having suprapubic discomfort.  She denies dysuria and gross hematuria.  She also denies fevers, chills, nausea or vomiting.   She is drinking 8 oz every hour while awake during the day.  UA demonstrates 0-5 WBC's and 0-10 RBC's.   Her PVR was 56 mL.    She does have a history of nephrolithiasis with tiny stones seen on CT in 2014.    She had not been using her vaginal estrogen cream.     PMH: Past Medical History:  Diagnosis Date  . Arthritis   . Atrophic vaginitis   . Diabetes mellitus without complication ([place])    Borderline Diabetes (diet controlled)  . Frequency   . History of bladder infections   . Meniere disease   . Nystagmus   . OAB (overactive bladder)   . Renal stones   . Stress incontinence   . Vertigo   . Vitamin D deficiency     Surgical History: Past Surgical History:  Procedure Laterality Date  . ABDOMINAL HYSTERECTOMY  2002  . BREAST BIOPSY Right 2009   neg  . BREAST EXCISIONAL BIOPSY Right 1970   neg  . ESOPHAGOGASTRODUODENOSCOPY N/A 11/16/2015   Procedure: ESOPHAGOGASTRODUODENOSCOPY (EGD);   Surgeon: Lollie Sails, MD;  Location: [facility] ENDOSCOPY;  Service: Endoscopy;  Laterality: N/A;  . JOINT REPLACEMENT  06/2013   hip  . maxillary sinusotomy intranasal    . TONSILLECTOMY      Home Medications:  Allergies as of 12/29/2016      Reactions   Codeine       Medication List       Accurate as of 12/29/16 10:49 AM. Always use your most recent med list.          alendronate 70 MG tablet Commonly known as:  FOSAMAX Take by mouth.   atorvastatin 80 MG tablet Commonly known as:  LIPITOR Take by mouth.   Calcium Carbonate-Vitamin D 600-400 MG-UNIT tablet Take 2 tablets by mouth daily.   estradiol 0.1 MG/GM vaginal cream Commonly known as:  ESTRACE Place 1 Applicatorful vaginally every 14 (fourteen) days.   FISH OIL PO Take by mouth.   Lactobacillus Acidophilus Powd 1 tablet by Does not apply route daily.   levothyroxine 100 MCG tablet Commonly known as:  SYNTHROID, LEVOTHROID Take 100 mcg by mouth daily before breakfast.   Melatonin 10 MG Tabs Take 1 tablet by mouth at bedtime.   meloxicam 15 MG tablet Commonly known as:  MOBIC Take by mouth.   metoCLOPramide 10 MG tablet Commonly known as:  REGLAN Take 1 tablet (10 mg  total) by mouth every 6 (six) hours as needed for nausea or vomiting.   montelukast 10 MG tablet Commonly known as:  SINGULAIR Take 10 mg by mouth at bedtime.   MULTI-VITAMINS Tabs Take by mouth.   OMEGA 3-6-9 FATTY ACIDS PO Take 1 capsule by mouth daily.   omeprazole 20 MG capsule Commonly known as:  PRILOSEC Take 20 mg by mouth 2 (two) times daily before a meal.   PRESERVISION AREDS 2 PO Take by mouth.   triamterene-hydrochlorothiazide 37.5-25 MG capsule Commonly known as:  DYAZIDE Take 1 capsule by mouth daily.   trimethoprim 100 MG tablet Commonly known as:  TRIMPEX Take 100 mg by mouth daily.   trimethoprim 100 MG tablet Commonly known as:  TRIMPEX Take one tablet after intercourse       Allergies:    Allergies  Allergen Reactions  . Codeine     Family History: Family History  Problem Relation Age of Onset  . Interstitial cystitis Daughter   . Kidney disease Father   . Prostate cancer Neg Hx   . Kidney cancer Neg Hx   . Bladder Cancer Neg Hx     Social History:  reports that she has never smoked. She has never used smokeless tobacco. She reports that she does not drink alcohol or use drugs.  ROS: UROLOGY Frequent Urination?: Yes Hard to postpone urination?: No Burning/pain with urination?: No Get up at night to urinate?: No Leakage of urine?: No Urine stream starts and stops?: No Trouble starting stream?: No Do you have to strain to urinate?: No Blood in urine?: No Urinary tract infection?: No Sexually transmitted disease?: No Injury to kidneys or bladder?: No Painful intercourse?: No Weak stream?: No Currently pregnant?: No Vaginal bleeding?: No Last menstrual period?: n  Gastrointestinal Nausea?: No Vomiting?: No Indigestion/heartburn?: No Diarrhea?: No Constipation?: No  Constitutional Fever: No Night sweats?: No Weight loss?: No Fatigue?: No  Skin Skin rash/lesions?: No Itching?: No  Eyes Blurred vision?: No Double vision?: No  Ears/Nose/Throat Sore throat?: No Sinus problems?: No  Hematologic/Lymphatic Swollen glands?: No Easy bruising?: No  Cardiovascular Leg swelling?: No Chest pain?: No  Respiratory Cough?: No Shortness of breath?: No  Endocrine Excessive thirst?: No  Musculoskeletal Back pain?: No Joint pain?: Yes  Neurological Headaches?: No Dizziness?: No  Psychologic Depression?: No Anxiety?: No  Physical Exam: Ht 5\' 2"  (1.575 m)   Wt 168 lb 1.6 oz (76.2 kg)   BMI 30.75 kg/m   Constitutional: Well nourished. Alert and oriented, No acute distress. HEENT: [place] AT, moist mucus membranes. Trachea midline, no masses. Cardiovascular: No clubbing, cyanosis, or edema. Respiratory: Normal respiratory effort, no  increased work of breathing. GI: Abdomen is soft, non tender, non distended, no abdominal masses. Liver and spleen not palpable.  No hernias appreciated.  Stool sample for occult testing is not indicated.   GU: No CVA tenderness.  No bladder fullness or masses.   Skin: No rashes, bruises or suspicious lesions. Lymph: No cervical or inguinal adenopathy. Neurologic: Grossly intact, no focal deficits, moving all 4 extremities. Psychiatric: Normal mood and affect.  Laboratory Data: Lab Results  Component Value Date   WBC 8.9 06/17/2015   HGB 12.9 06/17/2015   HCT 38.2 06/17/2015   MCV 87.3 06/17/2015   PLT 333 06/17/2015    Lab Results  Component Value Date   CREATININE 0.71 06/17/2015    Urinalysis 0-5 WBC's.  0-10 RBC's.  See EPIC.    Assessment & Plan:    1.  UTI  - Urinalysis, Complete - still with some pus and blood - will send for culture  2. Microscopic hematuria  - send UA for culture, if returns negative may need to pursue hematuria work up  3. Atrophic vaginitis  - sample of Premarin given  -scripts sent to pharmacy   Return for pending urine culture results.  Zara Council, [facility] [address] [place], [place] [address] [phone]

## 2016-12-31 ENCOUNTER — Telehealth: Payer: Self-pay

## 2016-12-31 LAB — CULTURE, URINE COMPREHENSIVE

## 2016-12-31 NOTE — Telephone Encounter (Signed)
Pt called stating premarin cream given at appt cost $300 and she cant afford that. Pt requested compounded medication. Per [place] estradiol was called into [facility].

## 2017-01-01 ENCOUNTER — Telehealth: Payer: Self-pay

## 2017-01-01 DIAGNOSIS — R3129 Other microscopic hematuria: Secondary | ICD-10-CM

## 2017-01-01 NOTE — Telephone Encounter (Signed)
Spoke with pt in reference to ucx results, cysto, and CT. Pt voiced understanding. CT urogram orders placed and pt was transferred to the front to make cysto appt.

## 2017-01-01 NOTE — Telephone Encounter (Signed)
-----   Message from Nori Riis, PA-C sent at 12/31/2016  8:16 PM EST ----- Please notify the patient that her urine culture was negative.  We should have her schedule a CT Urogram and cystoscopy at this time as she did have blood in her urine at her visit.

## 2017-01-16 ENCOUNTER — Ambulatory Visit
Admission: RE | Admit: 2017-01-16 | Discharge: 2017-01-16 | Disposition: A | Discharge status: Home / Self Care | Payer: Medicare Other | Source: Ambulatory Visit | Attending: Urology | Admitting: Urology

## 2017-01-16 DIAGNOSIS — R3129 Other microscopic hematuria: Secondary | ICD-10-CM | POA: Insufficient documentation

## 2017-01-16 DIAGNOSIS — I864 Gastric varices: Secondary | ICD-10-CM | POA: Insufficient documentation

## 2017-01-16 DIAGNOSIS — M1612 Unilateral primary osteoarthritis, left hip: Secondary | ICD-10-CM | POA: Diagnosis not present

## 2017-01-16 DIAGNOSIS — Z96641 Presence of right artificial hip joint: Secondary | ICD-10-CM | POA: Insufficient documentation

## 2017-01-16 DIAGNOSIS — N281 Cyst of kidney, acquired: Secondary | ICD-10-CM | POA: Insufficient documentation

## 2017-01-16 MED ORDER — IOPAMIDOL (ISOVUE-300) INJECTION 61%
125.0000 mL | Freq: Once | INTRAVENOUS | Status: AC | PRN
  Administered 2017-01-16: 125 mL via INTRAVENOUS

## 2017-01-21 ENCOUNTER — Encounter: Payer: Self-pay | Admitting: Urology

## 2017-01-21 ENCOUNTER — Ambulatory Visit (INDEPENDENT_AMBULATORY_CARE_PROVIDER_SITE_OTHER): Payer: Medicare Other | Admitting: Urology

## 2017-01-21 VITALS — BP 95/66 | HR 67 | Ht 62.0 in | Wt 170.8 lb

## 2017-01-21 DIAGNOSIS — R3129 Other microscopic hematuria: Secondary | ICD-10-CM

## 2017-01-21 LAB — URINALYSIS, COMPLETE
BILIRUBIN UA: NEGATIVE
GLUCOSE, UA: NEGATIVE
KETONES UA: NEGATIVE
LEUKOCYTES UA: NEGATIVE
NITRITE UA: NEGATIVE
Specific Gravity, UA: 1.015 (ref 1.005–1.030)
UUROB: 0.2 mg/dL (ref 0.2–1.0)
pH, UA: 7 (ref 5.0–7.5)

## 2017-01-21 LAB — MICROSCOPIC EXAMINATION

## 2017-01-21 MED ORDER — CIPROFLOXACIN HCL 500 MG PO TABS
500.0000 mg | ORAL_TABLET | Freq: Once | ORAL | Status: AC
  Administered 2017-01-21: 500 mg via ORAL

## 2017-01-21 MED ORDER — LIDOCAINE HCL 2 % EX GEL
1.0000 "application " | Freq: Once | CUTANEOUS | Status: AC
  Administered 2017-01-21: 1 via URETHRAL

## 2017-01-21 NOTE — Progress Notes (Signed)
   01/21/17  CC:  Chief Complaint  Patient presents with  . Cysto    HPI: 66 year old female who presents today for completion of her microscopic hematuria workup. She underwent CT urogram on 01/16/2017 which shows no other GU pathology.  She does have intermittent episodes of frequency, urgency, incontinence, and nocturia with negative UAs at the time.  Blood pressure 95/66, pulse 67, height 5\' 2"  (1.575 m), weight 170 lb 12.8 oz (77.5 kg). NED. A&Ox3.   No respiratory distress   Abd soft, NT, ND Normal external genitalia with patent urethral meatus  Cystoscopy Procedure Note  Patient identification was confirmed, informed consent was obtained, and patient was prepped using Betadine solution.  Lidocaine jelly was administered per urethral meatus.    Preoperative abx where received prior to procedure.    Procedure: - Flexible cystoscope introduced, without any difficulty.   - Thorough search of the bladder revealed:    normal urethral meatus    normal urothelium    no stones    no ulcers     no tumors    no urethral polyps    no trabeculation  - Ureteral orifices were normal in position and appearance.  Post-Procedure: - Patient tolerated the procedure well  Assessment/ Plan:  1. Microscopic hematuria s/p negative work up including CT urogram and cystoscopy today Recommend annual UA by PCP, refer back of microscopic hematuria persists in 2-3 years - Urinalysis, Complete - ciprofloxacin (CIPRO) tablet 500 mg; Take 1 tablet (500 mg total) by mouth once. - lidocaine (XYLOCAINE) 2 % jelly 1 application; Place 1 application into the urethra once.  Return if symptoms worsen or fail to improve.  Hollice Espy, MD

## 2017-02-10 DIAGNOSIS — Z87448 Personal history of other diseases of urinary system: Secondary | ICD-10-CM | POA: Insufficient documentation

## 2017-05-07 ENCOUNTER — Other Ambulatory Visit: Payer: Self-pay | Admitting: Family Medicine

## 2017-05-07 DIAGNOSIS — Z1231 Encounter for screening mammogram for malignant neoplasm of breast: Secondary | ICD-10-CM

## 2017-05-19 ENCOUNTER — Ambulatory Visit
Admission: RE | Admit: 2017-05-19 | Discharge: 2017-05-19 | Disposition: A | Discharge status: Home / Self Care | Payer: Medicare Other | Source: Ambulatory Visit | Attending: Family Medicine | Admitting: Family Medicine

## 2017-05-19 DIAGNOSIS — Z1231 Encounter for screening mammogram for malignant neoplasm of breast: Secondary | ICD-10-CM | POA: Diagnosis not present

## 2017-10-29 ENCOUNTER — Telehealth: Payer: Self-pay

## 2017-10-29 NOTE — Telephone Encounter (Signed)
Pt called requesting an appt to be seen. Susan Harding made pt aware it was going to be Jan 10 before pt could be seen. At which point pt husband, Susan Harding, came to the phone and demanded to speak to a nurse. Spoke with Susan Harding in reference to wife and current UTI s/s. Made Susan Harding aware due to the holidays and providers on vacation the soonest we could get pt in would be Jan 8. Susan Harding stated pt would take that appt and he would tx pt himself.

## 2017-11-09 NOTE — Progress Notes (Signed)
11/10/2017 10:51 AM   Susan [place]ing 06/28/1951 [id number]  Referring provider: Dion Body, MD [facility] [place], [place] [address]  CC:   OAB follow up  HPI: 67 yo WF with a history of recurrent UTI's and hematuria who presents today for further management of her UTI's.  Background history 67 yo WF who presents today requesting an urgent appointment for urinary urgency and discomfort.  She stated that two weeks ago she started to experience dysuria, foul smelling urine and suprapubic discomfort.  She denied fevers, chills, nausea and vomiting.  She had Cipro on hand so she took three days of the antibiotic.  Her symptoms did not improve, so she took the Cipro for five more days.  Her symptoms did not really improve until three days ago.  She was going to cancel the appointment, but she was still experiencing suprapubic discomfort.   She had complaints of urgency x 3, frequency x 8, toilet mapping, incontinence x 3 and nocturia x 3.   She was having suprapubic discomfort.  She denied dysuria and gross hematuria.  She also denied fevers, chills, nausea or vomiting.   She is drinking 8 oz every hour while awake during the day.  UA demonstrated 0-5 WBC's and 0-10 RBC's.   Her PVR was 56 mL.   She does have a history of nephrolithiasis with tiny stones seen on CT in 2014.   She had not been using her vaginal estrogen cream.    CTU completed in 01/2017 noted no acute findings in the abdomen or pelvis. Specifically, scattered tiny bilateral renal cortical cysts are evident, similar to previous MRI, but no acute findings in the urinary tract.  Tiny hypodensities in the liver parenchyma, also likely cysts.  Abdominal Aortic Atherosclerois.  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   Cytoscopy was negative in 01/2017.  She has had three documented positive urine cultures since  September 2018.    Today, she is experiencing urgency x 0-3 (stable), frequency x 8 or more (stable), not restricting fluids to avoid visits to the restroom, is engaging in toilet mapping, incontinence x 0-3 (stable) and nocturia x 0-3 (stable).   Her PVR is 15 mL.   She is not having dysuria, gross hematuria or suprapubic pain.  She is not having fevers, chills, nausea or vomiting.    She is not using her vaginal estrogen cream three night weekly.    She was placed on suppressive antibiotics, but she was only taking the medication as needed.   She is not having urinary symptoms at this time.    PMH: Past Medical History:  Diagnosis Date  . Arthritis   . Atrophic vaginitis   . Frequency   . History of bladder infections   . Meniere disease   . Nystagmus   . OAB (overactive bladder)   . Renal stones   . Stress incontinence   . Vertigo   . Vitamin D deficiency     Surgical History: Past Surgical History:  Procedure Laterality Date  . ABDOMINAL HYSTERECTOMY  2002  . BREAST BIOPSY Right 2009   neg  . BREAST EXCISIONAL BIOPSY Right 1970   neg  . ESOPHAGOGASTRODUODENOSCOPY N/A 11/16/2015   Procedure: ESOPHAGOGASTRODUODENOSCOPY (EGD);  Surgeon: Lollie Sails, MD;  Location: [facility] ENDOSCOPY;  Service: Endoscopy;  Laterality: N/A;  . JOINT REPLACEMENT  06/2013   hip  . maxillary sinusotomy  intranasal    . TONSILLECTOMY      Home Medications:  Allergies as of 11/10/2017      Reactions   Codeine       Medication List        Accurate as of 11/10/17 10:51 AM. Always use your most recent med list.          atorvastatin 80 MG tablet Commonly known as:  LIPITOR Take by mouth.   Calcium Carbonate-Vitamin D 600-400 MG-UNIT tablet Take 2 tablets by mouth daily.   estradiol 0.1 MG/GM vaginal cream Commonly known as:  ESTRACE VAGINAL Apply 0.47m (pea-sized amount)  just inside the vaginal introitus with a finger-tip every night for two weeks and then Monday, Wednesday and  Friday nights.   Lactobacillus Acidophilus Powd 1 tablet by Does not apply route daily.   levothyroxine 100 MCG tablet Commonly known as:  SYNTHROID, LEVOTHROID Take 100 mcg by mouth daily before breakfast.   Melatonin 10 MG Tabs Take 1 tablet by mouth at bedtime.   MULTI-VITAMINS Tabs Take by mouth.   OMEGA 3-6-9 FATTY ACIDS PO Take 1 capsule by mouth daily.   omeprazole 20 MG capsule Commonly known as:  PRILOSEC Take 20 mg by mouth 2 (two) times daily before a meal.   PRESERVISION AREDS 2 PO Take by mouth.   triamterene-hydrochlorothiazide 37.5-25 MG capsule Commonly known as:  DYAZIDE Take 1 capsule by mouth daily.   trimethoprim 100 MG tablet Commonly known as:  TRIMPEX Take 1 tablet (100 mg total) by mouth daily.       Allergies:  Allergies  Allergen Reactions  . Codeine     Family History: Family History  Problem Relation Age of Onset  . Interstitial cystitis Daughter   . Kidney disease Father   . Prostate cancer Neg Hx   . Kidney cancer Neg Hx   . Bladder Cancer Neg Hx   . Breast cancer Neg Hx     Social History:  reports that  has never smoked. she has never used smokeless tobacco. She reports that she does not drink alcohol or use drugs.  ROS: UROLOGY Frequent Urination?: No Hard to postpone urination?: No Burning/pain with urination?: No Get up at night to urinate?: No Leakage of urine?: No Urine stream starts and stops?: No Trouble starting stream?: No Do you have to strain to urinate?: No Blood in urine?: No Urinary tract infection?: No Sexually transmitted disease?: No Injury to kidneys or bladder?: No Painful intercourse?: No Weak stream?: No Currently pregnant?: No Vaginal bleeding?: No Last menstrual period?: n  Gastrointestinal Nausea?: No Vomiting?: No Indigestion/heartburn?: No Diarrhea?: No Constipation?: No  Constitutional Fever: No Night sweats?: No Weight loss?: No Fatigue?: No  Skin Skin rash/lesions?:  No Itching?: No  Eyes Blurred vision?: No Double vision?: No  Ears/Nose/Throat Sore throat?: No Sinus problems?: No  Hematologic/Lymphatic Swollen glands?: No Easy bruising?: No  Cardiovascular Leg swelling?: No Chest pain?: No  Respiratory Cough?: No Shortness of breath?: No  Endocrine Excessive thirst?: No  Musculoskeletal Back pain?: No Joint pain?: No  Neurological Headaches?: No Dizziness?: No  Psychologic Depression?: No Anxiety?: No  Physical Exam: BP 120/79 (BP Location: Left Arm, Patient Position: Sitting, Cuff Size: Normal)   Pulse 75   Ht 5' 2"  (1.575 m)   Wt 171 lb 14.4 oz (78 kg)   BMI 31.44 kg/m   Constitutional: Well nourished. Alert and oriented, No acute distress. HEENT: [place] AT, moist mucus membranes. Trachea midline, no masses. Cardiovascular: No  clubbing, cyanosis, or edema. Respiratory: Normal respiratory effort, no increased work of breathing. Skin: No rashes, bruises or suspicious lesions. Lymph: No cervical or inguinal adenopathy. Neurologic: Grossly intact, no focal deficits, moving all 4 extremities. Psychiatric: Normal mood and affect.   Laboratory Data: n/a    Assessment & Plan:    1. History of recurrent UTI's  - criteria for recurrent UTI has been met with 2 or more infections in 6 months or 3 or greater infections in one year   - Patient is drinking water until her urine is pale yellow or clear (10 to 12 cups daily)   - probiotics (yogurt, oral pills or vaginal suppositories), take cranberry pills or drink the juice and Vitamin C 1,000 mg daily to acidify the urine should be added to their daily regimen   - avoid soaking in tubs and wipe front to back after urinating   - advised them to have CATH UA's for urinalysis and culture to prevent skin contamination of the specimen  - reviewed symptoms of UTI and advised not to have urine checked or be treated for UTI if not experiencing symptoms  - will start three months of  daily Trimethoprim for three months - patient to contact us for any breakthrough infections                                     2. History of hematuria  - completed hematuria work up with CTU and cystoscopy completed in 01/2017 - no worrisome findings  3. Atrophic vaginitis  - patient will start using the cream three nights weekly  - RTC in three months for exam   Return in about 3 months (around 02/08/2018) for OAB questionnaire, PVR and exam.  Zara Council, [facility]  [place] [address], [place], [place] [address] [phone]

## 2017-11-10 ENCOUNTER — Ambulatory Visit (INDEPENDENT_AMBULATORY_CARE_PROVIDER_SITE_OTHER): Payer: Medicare Other | Admitting: Urology

## 2017-11-10 ENCOUNTER — Encounter: Payer: Self-pay | Admitting: Urology

## 2017-11-10 VITALS — BP 120/79 | HR 75 | Ht 62.0 in | Wt 171.9 lb

## 2017-11-10 DIAGNOSIS — N952 Postmenopausal atrophic vaginitis: Secondary | ICD-10-CM

## 2017-11-10 DIAGNOSIS — Z87448 Personal history of other diseases of urinary system: Secondary | ICD-10-CM | POA: Diagnosis not present

## 2017-11-10 DIAGNOSIS — Z87440 Personal history of urinary (tract) infections: Secondary | ICD-10-CM | POA: Diagnosis not present

## 2017-11-10 LAB — BLADDER SCAN AMB NON-IMAGING: SCAN RESULT: 15

## 2017-11-10 MED ORDER — TRIMETHOPRIM 100 MG PO TABS: 100.0000 mg | ORAL_TABLET | Freq: Every day | ORAL | 0 refills | Status: DC

## 2017-11-10 NOTE — Patient Instructions (Signed)

## 2018-02-05 NOTE — Progress Notes (Signed)
02/08/2018 9:00 AM   [name] 02/18/1951 [id number]  Referring provider: Dion Body, MD [facility] [place], [place] [address]  CC:   OAB follow up  HPI: 67 yo WF with a history of recurrent UTI's and history of hematuria who presents today for further management of her UTI's.  Background history 67 yo WF who presents today requesting an urgent appointment for urinary urgency and discomfort.  She stated that two weeks ago she started to experience dysuria, foul smelling urine and suprapubic discomfort.  She denied fevers, chills, nausea and vomiting.  She had Cipro on hand so she took three days of the antibiotic.  Her symptoms did not improve, so she took the Cipro for five more days.  Her symptoms did not really improve until three days ago.  She was going to cancel the appointment, but she was still experiencing suprapubic discomfort.   She had complaints of urgency x 3, frequency x 8, toilet mapping, incontinence x 3 and nocturia x 3.   She was having suprapubic discomfort.  She denied dysuria and gross hematuria.  She also denied fevers, chills, nausea or vomiting.   She is drinking 8 oz every hour while awake during the day.  UA demonstrated 0-5 WBC's and 0-10 RBC's.   Her PVR was 56 mL.   She does have a history of nephrolithiasis with tiny stones seen on CT in 2014.   She had not been using her vaginal estrogen cream.    CTU completed in 01/2017 noted no acute findings in the abdomen or pelvis. Specifically, scattered tiny bilateral renal cortical cysts are evident, similar to previous MRI, but no acute findings in the urinary tract.  Tiny hypodensities in the liver parenchyma, also likely cysts.  Abdominal Aortic Atherosclerois.  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   Cytoscopy was negative in 01/2017.  She has had three documented positive urine  cultures since September 2018.    At her visit on 11/11/2017, she was experiencing urgency x 0-3 (stable), frequency x 8 or more (stable), not restricting fluids to avoid visits to the restroom, is engaging in toilet mapping, incontinence x 0-3 (stable) and nocturia x 0-3 (stable).   Her PVR was 15 mL.   She is not having dysuria, gross hematuria or suprapubic pain.  She is not having fevers, chills, nausea or vomiting.  She is not using her vaginal estrogen cream three night weekly.  She was placed on suppressive antibiotics, but she was only taking the medication as needed.   She is not having urinary symptoms at this time.    Today, she is experiencing nocturia and SUI.  These are not bothersome to her.  Patient denies any gross hematuria, dysuria or suprapubic/flank pain.  Patient denies any fevers, chills, nausea or vomiting. She has not had any breakthrough infections.  She is using the vaginal estrogen cream three nights weekly.   Her UA was positive for 0-2 RBC's.    PMH: Past Medical History:  Diagnosis Date  . Arthritis   . Atrophic vaginitis   . Frequency   . History of bladder infections   . Meniere disease   . Nystagmus   . OAB (overactive bladder)   . Renal stones   . Stress incontinence   . Vertigo   . Vitamin D deficiency     Surgical History: Past Surgical History:  Procedure Laterality  Date  . ABDOMINAL HYSTERECTOMY  2002  . BREAST BIOPSY Right 2009   neg  . BREAST EXCISIONAL BIOPSY Right 1970   neg  . ESOPHAGOGASTRODUODENOSCOPY N/A 11/16/2015   Procedure: ESOPHAGOGASTRODUODENOSCOPY (EGD);  Surgeon: Lollie Sails, MD;  Location: [facility] ENDOSCOPY;  Service: Endoscopy;  Laterality: N/A;  . JOINT REPLACEMENT  06/2013   hip  . maxillary sinusotomy intranasal    . TONSILLECTOMY      Home Medications:  Allergies as of 02/08/2018      Reactions   Codeine       Medication List        Accurate as of 02/08/18  9:00 AM. Always use your most recent med list.           atorvastatin 80 MG tablet Commonly known as:  LIPITOR Take by mouth.   Calcium Carbonate-Vitamin D 600-400 MG-UNIT tablet Take 2 tablets by mouth daily.   estradiol 0.1 MG/GM vaginal cream Commonly known as:  ESTRACE VAGINAL Apply 0.20m (pea-sized amount)  just inside the vaginal introitus with a finger-tip every night for two weeks and then Monday, Wednesday and Friday nights.   Lactobacillus Acidophilus Powd 1 tablet by Does not apply route daily.   levothyroxine 100 MCG tablet Commonly known as:  SYNTHROID, LEVOTHROID Take 100 mcg by mouth daily before breakfast.   Melatonin 10 MG Tabs Take 1 tablet by mouth at bedtime.   MULTI-VITAMINS Tabs Take by mouth.   OMEGA 3-6-9 FATTY ACIDS PO Take 1 capsule by mouth daily.   omeprazole 20 MG capsule Commonly known as:  PRILOSEC Take 20 mg by mouth 2 (two) times daily before a meal.   PRESERVISION AREDS 2 PO Take by mouth.   triamterene-hydrochlorothiazide 37.5-25 MG capsule Commonly known as:  DYAZIDE Take 1 capsule by mouth daily.   trimethoprim 100 MG tablet Commonly known as:  TRIMPEX Take 1 tablet (100 mg total) by mouth daily.       Allergies:  Allergies  Allergen Reactions  . Codeine     Family History: Family History  Problem Relation Age of Onset  . Interstitial cystitis Daughter   . Kidney disease Father   . Prostate cancer Neg Hx   . Kidney cancer Neg Hx   . Bladder Cancer Neg Hx   . Breast cancer Neg Hx     Social History:  reports that she has never smoked. She has never used smokeless tobacco. She reports that she does not drink alcohol or use drugs.  ROS: UROLOGY Frequent Urination?: No Hard to postpone urination?: No Burning/pain with urination?: No Get up at night to urinate?: Yes Leakage of urine?: No Urine stream starts and stops?: No Trouble starting stream?: No Do you have to strain to urinate?: No Blood in urine?: No Urinary tract infection?: No Sexually transmitted  disease?: No Injury to kidneys or bladder?: No Painful intercourse?: No Weak stream?: No Currently pregnant?: No Vaginal bleeding?: No Last menstrual period?: n  Gastrointestinal Nausea?: No Vomiting?: No Indigestion/heartburn?: No Diarrhea?: No Constipation?: No  Constitutional Fever: No Night sweats?: No Weight loss?: No Fatigue?: No  Skin Skin rash/lesions?: No Itching?: No  Eyes Blurred vision?: No Double vision?: No  Ears/Nose/Throat Sore throat?: No Sinus problems?: No  Hematologic/Lymphatic Swollen glands?: No Easy bruising?: No  Cardiovascular Leg swelling?: No Chest pain?: No  Respiratory Cough?: No Shortness of breath?: No  Endocrine Excessive thirst?: No  Musculoskeletal Back pain?: No Joint pain?: Yes  Neurological Headaches?: No Dizziness?: No  Psychologic Depression?:  No  Physical Exam: BP 120/75   Pulse 70   Ht 5' 2" (1.575 m)   Wt 163 lb (73.9 kg)   BMI 29.81 kg/m   Constitutional: Well nourished. Alert and oriented, No acute distress. HEENT: [place] AT, moist mucus membranes. Trachea midline, no masses. Cardiovascular: No clubbing, cyanosis, or edema. Respiratory: Normal respiratory effort, no increased work of breathing. GI: Abdomen is soft, non tender, non distended, no abdominal masses. Liver and spleen not palpable.  No hernias appreciated.  Stool sample for occult testing is not indicated.   GU: No CVA tenderness.  No bladder fullness or masses.  Atrophic external genitalia, normal pubic hair distribution, no lesions.  Normal urethral meatus, no lesions, no prolapse, no discharge.   No urethral masses, tenderness and/or tenderness. No bladder fullness, tenderness or masses. Pale vagina mucosa, good estrogen effect, no discharge, no lesions, good pelvic support, Grade II cystocele is noted.  No rectocele noted.  Cervix and uterus are surgically absent.  No adnexal/parametria masses or tenderness noted.  Anus and perineum are  without rashes or lesions.    Skin: No rashes, bruises or suspicious lesions. Lymph: No cervical or inguinal adenopathy. Neurologic: Grossly intact, no focal deficits, moving all 4 extremities. Psychiatric: Normal mood and affect.  Laboratory Data: 0-2 RBC's.  See Epic.    Assessment & Plan:    1. History of recurrent UTI's  - criteria for recurrent UTI has been met with 2 or more infections in 6 months or 3 or greater infections in one year   - Patient is drinking water until her urine is pale yellow or clear (10 to 12 cups daily)   - probiotics (yogurt, oral pills or vaginal suppositories), take cranberry pills or drink the juice and Vitamin C 1,000 mg daily to acidify the urine should be added to their daily regimen   - avoid soaking in tubs and wipe front to back after urinating   - advised them to have CATH UA's for urinalysis and culture to prevent skin contamination of the specimen  - reviewed symptoms of UTI and advised not to have urine checked or be treated for UTI if not experiencing symptoms  - will stop the trimethoprim at this time - will contact office for breakthrough infections                       2. History of hematuria  - completed hematuria work up with CTU and cystoscopy completed in 01/2017 - no worrisome findings  - UA today is negative for AMH  3. Atrophic vaginitis  - patient will start using the cream three nights weekly  - RTC in one year for exam   Return in about 1 year (around 02/09/2019) for exam and symptom recheck .  Zara Council, [facility] [address] [place], [place] [address] [phone]

## 2018-02-08 ENCOUNTER — Ambulatory Visit (INDEPENDENT_AMBULATORY_CARE_PROVIDER_SITE_OTHER): Payer: Medicare Other | Admitting: Urology

## 2018-02-08 ENCOUNTER — Encounter: Payer: Self-pay | Admitting: Urology

## 2018-02-08 VITALS — BP 120/75 | HR 70 | Ht 62.0 in | Wt 163.0 lb

## 2018-02-08 DIAGNOSIS — Z87448 Personal history of other diseases of urinary system: Secondary | ICD-10-CM

## 2018-02-08 DIAGNOSIS — Z87440 Personal history of urinary (tract) infections: Secondary | ICD-10-CM | POA: Diagnosis not present

## 2018-02-08 DIAGNOSIS — N952 Postmenopausal atrophic vaginitis: Secondary | ICD-10-CM | POA: Diagnosis not present

## 2018-02-08 LAB — URINALYSIS, COMPLETE
Bilirubin, UA: NEGATIVE
GLUCOSE, UA: NEGATIVE
KETONES UA: NEGATIVE
Leukocytes, UA: NEGATIVE
Nitrite, UA: NEGATIVE
PROTEIN UA: NEGATIVE
SPEC GRAV UA: 1.02 (ref 1.005–1.030)
Urobilinogen, Ur: 0.2 mg/dL (ref 0.2–1.0)
pH, UA: 5.5 (ref 5.0–7.5)

## 2018-02-08 LAB — MICROSCOPIC EXAMINATION
Bacteria, UA: NONE SEEN
Epithelial Cells (non renal): NONE SEEN /hpf (ref 0–10)
WBC, UA: NONE SEEN /hpf (ref 0–5)

## 2018-02-08 NOTE — Patient Instructions (Signed)
Apply the vaginal estrogen cream Premarin 0.5mg  (pea-sized amount)  just inside the vaginal introitus with a finger-tip on Monday, Wednesday and Friday nights,

## 2018-02-23 ENCOUNTER — Telehealth: Payer: Self-pay | Admitting: Urology

## 2018-02-23 NOTE — Telephone Encounter (Signed)
[facility] is [place] is compounding vaginal estrogen cream and also we can use [facility] in [place].

## 2018-02-23 NOTE — Telephone Encounter (Signed)
Pt called office stating she saw Larene Beach recently and discussed Estrace cream, pt told Larene Beach she had "plenty" and patient does have supply, however the patient has realized her Estrace cream is out of date and would like to have a new Rx sent [facility] [address] Pt stated shannon mentioned getting compounding cream at a cheaper cost than $300.  Please advise. Thanks.

## 2018-02-24 ENCOUNTER — Telehealth: Payer: Self-pay | Admitting: Urology

## 2018-02-24 NOTE — Telephone Encounter (Signed)
She said she would like it called in to [facility]   Sharyn Lull

## 2018-02-24 NOTE — Telephone Encounter (Signed)
Would you call the compounding vaginal estrogen cream to [facility] for Mrs. Nutting?

## 2018-02-26 NOTE — Telephone Encounter (Signed)
Estrogen cream called in to [facility]. The compounding lady is out of the office until Monday.

## 2018-06-04 ENCOUNTER — Ambulatory Visit: Admit: 2018-06-04 | Payer: Medicare Other | Admitting: Gastroenterology

## 2018-06-04 SURGERY — ESOPHAGOGASTRODUODENOSCOPY (EGD) WITH PROPOFOL
Anesthesia: General

## 2018-07-08 DIAGNOSIS — Z96642 Presence of left artificial hip joint: Secondary | ICD-10-CM | POA: Insufficient documentation

## 2018-07-08 DIAGNOSIS — Z471 Aftercare following joint replacement surgery: Secondary | ICD-10-CM | POA: Insufficient documentation

## 2018-08-03 ENCOUNTER — Other Ambulatory Visit: Payer: Self-pay | Admitting: Family Medicine

## 2018-08-03 DIAGNOSIS — Z1231 Encounter for screening mammogram for malignant neoplasm of breast: Secondary | ICD-10-CM

## 2018-08-25 ENCOUNTER — Ambulatory Visit
Admission: RE | Admit: 2018-08-25 | Discharge: 2018-08-25 | Disposition: A | Discharge status: Home / Self Care | Payer: Medicare Other | Source: Ambulatory Visit | Attending: Family Medicine | Admitting: Family Medicine

## 2018-08-25 DIAGNOSIS — Z1231 Encounter for screening mammogram for malignant neoplasm of breast: Secondary | ICD-10-CM | POA: Diagnosis not present

## 2018-08-30 ENCOUNTER — Other Ambulatory Visit: Payer: Self-pay | Admitting: Family Medicine

## 2018-08-30 DIAGNOSIS — N6320 Unspecified lump in the left breast, unspecified quadrant: Secondary | ICD-10-CM

## 2018-08-30 DIAGNOSIS — R928 Other abnormal and inconclusive findings on diagnostic imaging of breast: Secondary | ICD-10-CM

## 2018-08-31 ENCOUNTER — Ambulatory Visit
Admission: RE | Admit: 2018-08-31 | Discharge: 2018-08-31 | Disposition: A | Discharge status: Home / Self Care | Payer: Medicare Other | Source: Ambulatory Visit | Attending: Family Medicine | Admitting: Family Medicine

## 2018-08-31 DIAGNOSIS — N6320 Unspecified lump in the left breast, unspecified quadrant: Secondary | ICD-10-CM | POA: Insufficient documentation

## 2018-08-31 DIAGNOSIS — R928 Other abnormal and inconclusive findings on diagnostic imaging of breast: Secondary | ICD-10-CM | POA: Diagnosis present

## 2019-02-04 ENCOUNTER — Ambulatory Visit: Admission: RE | Admit: 2019-02-04 | Payer: Medicare Other | Source: Home / Self Care | Admitting: Gastroenterology

## 2019-02-04 ENCOUNTER — Encounter: Admission: RE | Payer: Self-pay | Source: Home / Self Care

## 2019-02-04 SURGERY — EGD (ESOPHAGOGASTRODUODENOSCOPY)
Anesthesia: General

## 2019-02-10 ENCOUNTER — Other Ambulatory Visit: Payer: Self-pay

## 2019-02-10 ENCOUNTER — Telehealth: Payer: Self-pay | Admitting: Urology

## 2019-02-10 ENCOUNTER — Telehealth (INDEPENDENT_AMBULATORY_CARE_PROVIDER_SITE_OTHER): Payer: Medicare Other | Admitting: Urology

## 2019-02-10 DIAGNOSIS — R7303 Prediabetes: Secondary | ICD-10-CM | POA: Insufficient documentation

## 2019-02-10 DIAGNOSIS — Z87440 Personal history of urinary (tract) infections: Secondary | ICD-10-CM | POA: Diagnosis not present

## 2019-02-10 DIAGNOSIS — N3281 Overactive bladder: Secondary | ICD-10-CM

## 2019-02-10 DIAGNOSIS — N952 Postmenopausal atrophic vaginitis: Secondary | ICD-10-CM

## 2019-02-10 DIAGNOSIS — H9190 Unspecified hearing loss, unspecified ear: Secondary | ICD-10-CM | POA: Insufficient documentation

## 2019-02-10 DIAGNOSIS — Z87448 Personal history of other diseases of urinary system: Secondary | ICD-10-CM

## 2019-02-10 MED ORDER — TRIMETHOPRIM 100 MG PO TABS: 100.0000 mg | ORAL_TABLET | Freq: Every day | ORAL | 0 refills | Status: DC

## 2019-02-10 NOTE — Progress Notes (Signed)
Virtual Visit via Telephone Note  I connected with [name] on 02/10/2019 at 0905 by audio/visual and verified that I am speaking with the correct person using two identifiers.  They are located at home.  I am located at my home.    This visit type was conducted due to national recommendations for restrictions regarding the COVID-19 Pandemic (e.g. social distancing).  This format is felt to be most appropriate for this patient at this time.  All issues noted in this document were discussed and addressed.  No physical exam was performed.   I discussed the limitations, risks, security and privacy concerns of performing an evaluation and management service by telephone and the availability of in person appointments. I also discussed with the patient that there may be a patient responsible charge related to this service. The patient expressed understanding and agreed to proceed.   History of Present Illness: Susan Harding is a 68 year old Caucasian female with a history of recurrent UTIs, history of hematuria, vaginal atrophy and urinary frequency who presents today for video visit for yearly follow-up.  She states that she has not had any urinary tract infections since our visit last year.  She does have trimethoprim 100 mg on hand which she states she takes 1 tablet twice weekly.  Patient denies any gross hematuria, dysuria or suprapubic/flank pain.  Patient denies any fevers, chills, nausea or vomiting.   She completed a hematuria work-up in March 2018 with a CT urogram and cystoscopy with no worrisome findings.  She has not reported any gross hematuria.  She states that she is only using the vaginal estrogen cream once a week.  She states that she is not experiencing any vaginal burning, irritation or discharge.    She states that she is managing her urinary frequency by avoiding caffeine and spicy foods.   Observations/Objective:   Assessment and Plan:  1. History of  rUTI's Patient will continue trimethoprim as needed for urinary tract symptoms as she is only using 1 capsule once or twice weekly She will contact us for any breakthrough urinary tract infections  2. History of hematuria Hematuria work up completed in 01/2017 - findings positive for NED No report of gross hematuria  UA in three months to check for [facility] Patient to report any gross hematuria in the interim    3. Atrophic vaginitis Patient will discontinue the vaginal estrogen cream at this time as she is not having any further recurrent urinary tract infections or vaginal irritation  4. OAB Continue to manage conservatively by avoiding caffeine and spicy foods She will contact us if her symptoms worsen  Follow Up Instructions:  [name] will return in 3 months for a laboratory visit for urinalysis to check for microscopic hematuria and she is to report any gross hematuria in the interim    I discussed the assessment and treatment plan with the patient. The patient was provided an opportunity to ask questions and all were answered. The patient agreed with the plan and demonstrated an understanding of the instructions.   The patient was advised to call back or seek an in-person evaluation if the symptoms worsen or if the condition fails to improve as anticipated.  I provided 10 minutes of face-to-face time during this encounter.   Dave Mannes, PA-C

## 2019-02-10 NOTE — Telephone Encounter (Signed)
mychart sent apt made

## 2019-02-10 NOTE — Telephone Encounter (Signed)
Would you call Susan Harding and schedule her a lab visit for a UA in three months to look for microscopic blood?

## 2019-05-12 ENCOUNTER — Other Ambulatory Visit: Payer: Medicare Other

## 2019-05-12 ENCOUNTER — Other Ambulatory Visit: Payer: Self-pay

## 2019-05-12 DIAGNOSIS — Z87448 Personal history of other diseases of urinary system: Secondary | ICD-10-CM

## 2019-05-12 LAB — URINALYSIS, COMPLETE
Bilirubin, UA: NEGATIVE
Glucose, UA: NEGATIVE
Ketones, UA: NEGATIVE
Leukocytes,UA: NEGATIVE
Nitrite, UA: NEGATIVE
Protein,UA: NEGATIVE
RBC, UA: NEGATIVE
Specific Gravity, UA: 1.015 (ref 1.005–1.030)
Urobilinogen, Ur: 0.2 mg/dL (ref 0.2–1.0)
pH, UA: 7.5 (ref 5.0–7.5)

## 2019-05-12 LAB — MICROSCOPIC EXAMINATION: Bacteria, UA: NONE SEEN

## 2019-05-24 ENCOUNTER — Telehealth: Payer: Self-pay | Admitting: Urology

## 2019-05-24 NOTE — Telephone Encounter (Signed)
Patient notified , please schedule follow up

## 2019-05-24 NOTE — Telephone Encounter (Signed)
Would you please let Mrs. Susan Harding know that her UA did not demonstrate any microscopic blood?   We will need to see her again in one year.

## 2019-05-24 NOTE — Telephone Encounter (Signed)
1 year follow up appointment made.

## 2019-08-09 ENCOUNTER — Other Ambulatory Visit: Payer: Self-pay | Admitting: Family Medicine

## 2019-08-09 DIAGNOSIS — Z1231 Encounter for screening mammogram for malignant neoplasm of breast: Secondary | ICD-10-CM

## 2019-08-15 ENCOUNTER — Other Ambulatory Visit
Admission: RE | Admit: 2019-08-15 | Discharge: 2019-08-15 | Disposition: A | Discharge status: Home / Self Care | Payer: Medicare Other | Source: Ambulatory Visit | Attending: Gastroenterology | Admitting: Gastroenterology

## 2019-08-15 DIAGNOSIS — Z20828 Contact with and (suspected) exposure to other viral communicable diseases: Secondary | ICD-10-CM | POA: Diagnosis not present

## 2019-08-15 DIAGNOSIS — Z01812 Encounter for preprocedural laboratory examination: Secondary | ICD-10-CM | POA: Diagnosis present

## 2019-08-16 LAB — SARS CORONAVIRUS 2 (TAT 6-24 HRS): SARS Coronavirus 2: NEGATIVE

## 2019-08-17 ENCOUNTER — Encounter: Payer: Self-pay | Admitting: *Deleted

## 2019-08-18 ENCOUNTER — Ambulatory Visit: Payer: Medicare Other | Admitting: Anesthesiology

## 2019-08-18 ENCOUNTER — Other Ambulatory Visit: Payer: Self-pay

## 2019-08-18 ENCOUNTER — Encounter
Admission: RE | Disposition: A | Discharge status: Home / Self Care | Payer: Self-pay | Source: Home / Self Care | Attending: Gastroenterology

## 2019-08-18 ENCOUNTER — Ambulatory Visit
Admission: RE | Admit: 2019-08-18 | Discharge: 2019-08-18 | Disposition: A | Discharge status: Home / Self Care | Payer: Medicare Other | Attending: Gastroenterology | Admitting: Gastroenterology

## 2019-08-18 DIAGNOSIS — K259 Gastric ulcer, unspecified as acute or chronic, without hemorrhage or perforation: Secondary | ICD-10-CM | POA: Insufficient documentation

## 2019-08-18 DIAGNOSIS — Z96649 Presence of unspecified artificial hip joint: Secondary | ICD-10-CM | POA: Insufficient documentation

## 2019-08-18 DIAGNOSIS — Z883 Allergy status to other anti-infective agents status: Secondary | ICD-10-CM | POA: Diagnosis not present

## 2019-08-18 DIAGNOSIS — Z79890 Hormone replacement therapy (postmenopausal): Secondary | ICD-10-CM | POA: Diagnosis not present

## 2019-08-18 DIAGNOSIS — K319 Disease of stomach and duodenum, unspecified: Secondary | ICD-10-CM | POA: Insufficient documentation

## 2019-08-18 DIAGNOSIS — Z79899 Other long term (current) drug therapy: Secondary | ICD-10-CM | POA: Diagnosis not present

## 2019-08-18 DIAGNOSIS — K2100 Gastro-esophageal reflux disease with esophagitis, without bleeding: Secondary | ICD-10-CM | POA: Diagnosis not present

## 2019-08-18 DIAGNOSIS — K228 Other specified diseases of esophagus: Secondary | ICD-10-CM | POA: Insufficient documentation

## 2019-08-18 DIAGNOSIS — E7800 Pure hypercholesterolemia, unspecified: Secondary | ICD-10-CM | POA: Diagnosis not present

## 2019-08-18 DIAGNOSIS — K2960 Other gastritis without bleeding: Secondary | ICD-10-CM | POA: Insufficient documentation

## 2019-08-18 DIAGNOSIS — E039 Hypothyroidism, unspecified: Secondary | ICD-10-CM | POA: Diagnosis not present

## 2019-08-18 DIAGNOSIS — Z885 Allergy status to narcotic agent status: Secondary | ICD-10-CM | POA: Insufficient documentation

## 2019-08-18 DIAGNOSIS — K449 Diaphragmatic hernia without obstruction or gangrene: Secondary | ICD-10-CM | POA: Insufficient documentation

## 2019-08-18 DIAGNOSIS — M1990 Unspecified osteoarthritis, unspecified site: Secondary | ICD-10-CM | POA: Insufficient documentation

## 2019-08-18 DIAGNOSIS — K2270 Barrett's esophagus without dysplasia: Secondary | ICD-10-CM | POA: Diagnosis present

## 2019-08-18 HISTORY — DX: Pure hypercholesterolemia, unspecified: E78.00

## 2019-08-18 HISTORY — DX: Disorder of thyroid, unspecified: E07.9

## 2019-08-18 HISTORY — PX: ESOPHAGOGASTRODUODENOSCOPY (EGD) WITH PROPOFOL: SHX5813

## 2019-08-18 HISTORY — DX: Personal history of urinary (tract) infections: Z87.440

## 2019-08-18 HISTORY — DX: Other gastritis without bleeding: K29.60

## 2019-08-18 HISTORY — DX: Gastro-esophageal reflux disease without esophagitis: K21.9

## 2019-08-18 HISTORY — DX: Other specified postprocedural states: Z98.890

## 2019-08-18 HISTORY — DX: Benign neoplasm of stomach: D13.1

## 2019-08-18 HISTORY — DX: Nausea with vomiting, unspecified: R11.2

## 2019-08-18 SURGERY — ESOPHAGOGASTRODUODENOSCOPY (EGD) WITH PROPOFOL
Anesthesia: General

## 2019-08-18 MED ORDER — LIDOCAINE HCL (CARDIAC) PF 100 MG/5ML IV SOSY
PREFILLED_SYRINGE | INTRAVENOUS | Status: DC | PRN
  Administered 2019-08-18: 50 mg via INTRAVENOUS

## 2019-08-18 MED ORDER — SODIUM CHLORIDE 0.9 % IV SOLN
INTRAVENOUS | Status: DC
  Administered 2019-08-18: 10:00:00 via INTRAVENOUS

## 2019-08-18 MED ORDER — PROPOFOL 500 MG/50ML IV EMUL
INTRAVENOUS | Status: DC | PRN
  Administered 2019-08-18: 130 ug/kg/min via INTRAVENOUS

## 2019-08-18 MED ORDER — PROPOFOL 500 MG/50ML IV EMUL
INTRAVENOUS | Status: AC
  Filled 2019-08-18: qty 50

## 2019-08-18 MED ORDER — LIDOCAINE HCL (PF) 2 % IJ SOLN
INTRAMUSCULAR | Status: AC
  Filled 2019-08-18: qty 10

## 2019-08-18 MED ORDER — PROPOFOL 10 MG/ML IV BOLUS
INTRAVENOUS | Status: DC | PRN
  Administered 2019-08-18: 80 mg via INTRAVENOUS

## 2019-08-18 NOTE — Anesthesia Post-op Follow-up Note (Signed)
Anesthesia QCDR form completed.        

## 2019-08-18 NOTE — Op Note (Addendum)
[facility] Gastroenterology Patient Name: Susan Harding Procedure Date: 08/18/2019 10:16 AM MRN: [id number] Account #: [id number] Date of Birth: 07-Mar-1951 Admit Type: Outpatient Age: 68 Room: [facility] ENDO ROOM 3 Gender: Female Note Status: Finalized Procedure:            Upper GI endoscopy Indications:          Follow-up of Barrett's esophagus Providers:            Lollie Sails, MD Referring MD:         Dion Body (Referring MD) Medicines:            Monitored Anesthesia Care Complications:        No immediate complications. Procedure:            Pre-Anesthesia Assessment:                       - ASA Grade Assessment: II - A patient with mild                        systemic disease.                       After obtaining informed consent, the endoscope was                        passed under direct vision. Throughout the procedure,                        the patient's blood pressure, pulse, and oxygen                        saturations were monitored continuously. The Endoscope                        was introduced through the mouth, and advanced to the                        third part of duodenum. The upper GI endoscopy was                        accomplished without difficulty. The patient tolerated                        the procedure well. Findings:      The Z-line was variable.      LA Grade A (one or more mucosal breaks less than 5 mm, not extending       between tops of 2 mucosal folds) esophagitis with no bleeding was found.       Mucosa was biopsied with a cold forceps for histology in a targeted       manner and in 4 quadrants in the lower third of the esophagus. One       specimen bottle was sent to pathology.      A small hiatal hernia was found. The Z-line was a variable distance from       incisors; the hiatal hernia was sliding.      Patchy moderate inflammation characterized by congestion (edema),       erosions, linear erosions  and shallow ulcerations was found in the       gastric body and in the gastric antrum. Biopsies  were taken with a cold       forceps for histology. Biopsies were taken with a cold forceps for       Helicobacter pylori testing.      One non-bleeding superficial gastric ulcer was found on the posterior       wall of the gastric antrum. The lesion was 3 mm in largest dimension.      One non-bleeding cratered gastric ulcer with no stigmata of bleeding was       found in the prepyloric region of the stomach. The lesion was 5 mm in       largest dimension.      One non-bleeding superficial gastric ulcer with no stigmata of bleeding       was found at the incisura. The lesion was 3 mm in largest dimension.      Multiple non-bleeding dispersed erosions were found at the incisura, in       the gastric antrum and in the prepyloric region of the stomach. There       were no stigmata of recent bleeding.      Localized mild inflammation characterized by congestion (edema),       erosions and erythema was found on the posterior wall of the gastric       antrum. Biopsies were taken with a cold forceps for histology.      The cardia and gastric fundus were normal on retroflexion otherwise.      The examined duodenum was normal. Impression:           - Z-line variable.                       - LA Grade A erosive esophagitis. Biopsied.                       - Small hiatal hernia.                       - Gastritis. Biopsied.                       - Non-bleeding gastric ulcer.                       - Non-bleeding gastric ulcer with no stigmata of                        bleeding.                       - Non-bleeding gastric ulcer with no stigmata of                        bleeding.                       - Gastric erosions without bleeding.                       - Erosive gastritis. Biopsied.                       - Normal examined duodenum. Recommendation:       - Discharge patient to home.                        -  Use Protonix (pantoprazole) 40 mg PO BID for 5 weeks.                       - Use Protonix (pantoprazole) 40 mg PO daily.                       - Await pathology results.                       - Repeat upper endoscopy in 8 weeks to check healing. Procedure Code(s):    --- Professional ---                       [phone], Esophagogastroduodenoscopy, flexible, transoral;                        with biopsy, single or multiple Diagnosis Code(s):    --- Professional ---                       K22.8, Other specified diseases of esophagus                       K20.8, Other esophagitis                       K44.9, Diaphragmatic hernia without obstruction or                        gangrene                       K29.70, Gastritis, unspecified, without bleeding                       K29.60, Other gastritis without bleeding                       K25.9, Gastric ulcer, unspecified as acute or chronic,                        without hemorrhage or perforation                       K22.70, Barrett's esophagus without dysplasia CPT copyright 2019 American Medical Association. All rights reserved. The codes documented in this report are preliminary and upon coder review may  be revised to meet current compliance requirements. Lollie Sails, MD 08/18/2019 10:59:46 AM This report has been signed electronically. Number of Addenda: 0 Note Initiated On: 08/18/2019 10:16 AM      [facility]

## 2019-08-18 NOTE — Anesthesia Postprocedure Evaluation (Signed)
Anesthesia Post Note  Patient: Susan Harding  Procedure(s) Performed: ESOPHAGOGASTRODUODENOSCOPY (EGD) WITH PROPOFOL (N/A )  Patient location during evaluation: Endoscopy Anesthesia Type: General Level of consciousness: awake and alert Pain management: pain level controlled Vital Signs Assessment: post-procedure vital signs reviewed and stable Respiratory status: spontaneous breathing, nonlabored ventilation and respiratory function stable Cardiovascular status: blood pressure returned to baseline and stable Postop Assessment: no apparent nausea or vomiting Anesthetic complications: no     Last Vitals:  Vitals:   08/18/19 1123 08/18/19 1133  BP: 108/65 109/71  Pulse: 60 (!) 58  Resp: 13 12  Temp:    SpO2: 98% 98%    Last Pain:  Vitals:   08/18/19 1133  TempSrc:   PainSc: 0-No pain                 Alphonsus Sias

## 2019-08-18 NOTE — Transfer of Care (Signed)
Immediate Anesthesia Transfer of Care Note  Patient: Susan Harding  Procedure(s) Performed: ESOPHAGOGASTRODUODENOSCOPY (EGD) WITH PROPOFOL (N/A )  Patient Location: PACU and Endoscopy Unit  Anesthesia Type:General  Level of Consciousness: drowsy  Airway & Oxygen Therapy: Patient Spontanous Breathing and Patient connected to nasal cannula oxygen  Post-op Assessment: Report given to RN and Post -op Vital signs reviewed and stable  Post vital signs: Reviewed and stable  Last Vitals:  Vitals Value Taken Time  BP 104/66 08/18/19 1054  Temp    Pulse 74 08/18/19 1054  Resp 14 08/18/19 1054  SpO2 91 % 08/18/19 1054  Vitals shown include unvalidated device data.  Last Pain:  Vitals:   08/18/19 1053  TempSrc: (P) Tympanic  PainSc:          Complications: No apparent anesthesia complications

## 2019-08-18 NOTE — H&P (Signed)
Outpatient short stay form Pre-procedure 08/18/2019 10:14 AM Lollie Sails MD  Primary Physician: Dr Dion Body  Reason for visit: EGD  History of present illness: Patient is a 68 year old female presenting today for an EGD in regards her personal history of Barrett's esophagus.  She has been taking omeprazole 20 mg a day with good good resolution of symptoms.  She has no heartburn or dysphagia.  She takes no aspirin or blood thinning agent.    Current Facility-Administered Medications:  .  0.9 %  sodium chloride infusion, , Intravenous, Continuous, Lollie Sails, MD, Last Rate: 20 mL/hr at 08/18/19 0941  Medications Prior to Admission  Medication Sig Dispense Refill Last Dose  . atorvastatin (LIPITOR) 80 MG tablet Take by mouth.   08/17/2019 at Unknown time  . Calcium Carbonate-Vitamin D 600-400 MG-UNIT tablet Take 2 tablets by mouth daily.   Past Week at Unknown time  . estradiol (ESTRACE VAGINAL) 0.1 MG/GM vaginal cream Apply 0.5mg  (pea-sized amount)  just inside the vaginal introitus with a finger-tip every night for two weeks and then Monday, Wednesday and Friday nights. 30 g 12 08/17/2019 at Unknown time  . levothyroxine (SYNTHROID, LEVOTHROID) 100 MCG tablet Take 100 mcg by mouth daily before breakfast.   08/17/2019 at Unknown time  . Melatonin 10 MG TABS Take 1 tablet by mouth at bedtime.   Past Week at Unknown time  . Multiple Vitamin (MULTI-VITAMINS) TABS Take by mouth.   Past Week at Unknown time  . Multiple Vitamins-Minerals (PRESERVISION AREDS 2 PO) Take by mouth.   Past Week at Unknown time  . OMEGA 3-6-9 FATTY ACIDS PO Take 1 capsule by mouth daily.   Past Week at Unknown time  . omeprazole (PRILOSEC) 20 MG capsule Take 20 mg by mouth 2 (two) times daily before a meal.   08/17/2019 at Unknown time  . traMADol (ULTRAM) 50 MG tablet Take by mouth every 6 (six) hours as needed.     . triamterene-hydrochlorothiazide (DYAZIDE) 37.5-25 MG per capsule Take 1 capsule  by mouth daily.   08/17/2019 at Unknown time  . Lactobacillus Acidophilus POWD 1 tablet by Does not apply route daily.     Marland Kitchen trimethoprim (TRIMPEX) 100 MG tablet Take 1 tablet (100 mg total) by mouth daily. (Patient not taking: Reported on 08/18/2019) 90 tablet 0 Not Taking at Unknown time     Allergies  Allergen Reactions  . Codeine   . Diflucan [Fluconazole]      Past Medical History:  Diagnosis Date  . Arthritis   . Atrophic vaginitis   . Erosive gastritis   . Frequency   . Fundic gland polyps of stomach, benign   . GERD (gastroesophageal reflux disease)   . History of bladder infections   . History of recurrent UTIs   . Meniere disease   . Nystagmus   . OAB (overactive bladder)   . PONV (postoperative nausea and vomiting)   . Pure hypercholesterolemia   . Renal stones   . Stress incontinence   . Thyroid disease   . Vertigo   . Vitamin D deficiency     Review of systems:      Physical Exam    Heart and lungs: Regular rate and rhythm without rub or gallop lungs are bilaterally clear    HEENT: Normocephalic atraumatic eyes are anicteric    Other:    Pertinant exam for procedure: Soft nontender nondistended bowel sounds positive normoactive    Planned proceedures: EGD and indicated procedures. I  have discussed the risks benefits and complications of procedures to include not limited to bleeding, infection, perforation and the risk of sedation and the patient wishes to proceed.    Lollie Sails, MD Gastroenterology 08/18/2019  10:14 AM

## 2019-08-18 NOTE — Anesthesia Preprocedure Evaluation (Addendum)
Anesthesia Evaluation  Patient identified by MRN, date of birth, ID band Patient awake    Reviewed: Allergy & Precautions, H&P , NPO status , reviewed documented beta blocker date and time   History of Anesthesia Complications (+) PONV and history of anesthetic complications  Airway Mallampati: II  TM Distance: >3 FB Neck ROM: full    Dental  (+) Teeth Intact   Pulmonary    Pulmonary exam normal        Cardiovascular Normal cardiovascular exam  2019 Negative stress test, Nml EF 62%   Neuro/Psych    GI/Hepatic PUD, GERD  Medicated and Controlled,  Endo/Other  Hypothyroidism   Renal/GU Renal disease     Musculoskeletal  (+) Arthritis ,   Abdominal   Peds  Hematology   Anesthesia Other Findings Past Medical History: No date: Arthritis No date: Atrophic vaginitis No date: Erosive gastritis No date: Frequency No date: Fundic gland polyps of stomach, benign No date: GERD (gastroesophageal reflux disease) No date: History of bladder infections No date: History of recurrent UTIs No date: Meniere disease No date: Nystagmus No date: OAB (overactive bladder) No date: PONV (postoperative nausea and vomiting) No date: Pure hypercholesterolemia No date: Renal stones No date: Stress incontinence No date: Thyroid disease No date: Vertigo No date: Vitamin D deficiency  Past Surgical History: 2002: ABDOMINAL HYSTERECTOMY 2009: BREAST BIOPSY; Right     Comment:  neg 1970: BREAST EXCISIONAL BIOPSY; Right     Comment:  neg 11/16/2015: ESOPHAGOGASTRODUODENOSCOPY; N/A     Comment:  Procedure: ESOPHAGOGASTRODUODENOSCOPY (EGD);  Surgeon:               Lollie Sails, MD;  Location: [facility] ENDOSCOPY;                Service: Endoscopy;  Laterality: N/A; No date: HIP ARTHROPLASTY 06/2013: JOINT REPLACEMENT     Comment:  hip No date: maxillary sinusotomy intranasal No date: TONSILLECTOMY No date: TUBAL LIGATION  BMI     Body Mass Index: 30.00 kg/m      Reproductive/Obstetrics                           Anesthesia Physical Anesthesia Plan  ASA: II  Anesthesia Plan: General   Post-op Pain Management:    Induction: Intravenous  PONV Risk Score and Plan: Treatment may vary due to age or medical condition and TIVA  Airway Management Planned: Nasal Cannula and Natural Airway  Additional Equipment:   Intra-op Plan:   Post-operative Plan:   Informed Consent: I have reviewed the patients History and Physical, chart, labs and discussed the procedure including the risks, benefits and alternatives for the proposed anesthesia with the patient or authorized representative who has indicated his/her understanding and acceptance.     Dental Advisory Given  Plan Discussed with: CRNA  Anesthesia Plan Comments:        Anesthesia Quick Evaluation

## 2019-08-19 ENCOUNTER — Encounter: Payer: Self-pay | Admitting: Gastroenterology

## 2019-08-19 LAB — SURGICAL PATHOLOGY

## 2019-09-06 ENCOUNTER — Ambulatory Visit
Admission: RE | Admit: 2019-09-06 | Discharge: 2019-09-06 | Disposition: A | Discharge status: Home / Self Care | Payer: Medicare Other | Source: Ambulatory Visit | Attending: Family Medicine | Admitting: Family Medicine

## 2019-09-06 ENCOUNTER — Other Ambulatory Visit: Payer: Self-pay

## 2019-09-06 DIAGNOSIS — Z1231 Encounter for screening mammogram for malignant neoplasm of breast: Secondary | ICD-10-CM | POA: Insufficient documentation

## 2020-05-29 ENCOUNTER — Ambulatory Visit: Payer: Medicare Other | Admitting: Urology

## 2020-09-03 ENCOUNTER — Other Ambulatory Visit: Payer: Self-pay | Admitting: Family Medicine

## 2020-09-03 DIAGNOSIS — Z1231 Encounter for screening mammogram for malignant neoplasm of breast: Secondary | ICD-10-CM

## 2020-10-09 ENCOUNTER — Ambulatory Visit
Admission: RE | Admit: 2020-10-09 | Discharge: 2020-10-09 | Disposition: A | Discharge status: Home / Self Care | Payer: Medicare Other | Source: Ambulatory Visit | Attending: Family Medicine | Admitting: Family Medicine

## 2020-10-09 ENCOUNTER — Other Ambulatory Visit: Payer: Self-pay

## 2020-10-09 DIAGNOSIS — Z1231 Encounter for screening mammogram for malignant neoplasm of breast: Secondary | ICD-10-CM | POA: Diagnosis present

## 2021-02-05 ENCOUNTER — Telehealth: Payer: Self-pay | Admitting: Urology

## 2021-02-05 DIAGNOSIS — Z87448 Personal history of other diseases of urinary system: Secondary | ICD-10-CM

## 2021-02-05 NOTE — Telephone Encounter (Signed)
Would you call Mrs. Littrell and have her schedule an appointment for recheck on micro heme?

## 2021-02-07 NOTE — Telephone Encounter (Signed)
Spoke with patient and advised results  Lab appt scheduled  

## 2021-02-12 ENCOUNTER — Other Ambulatory Visit: Payer: Medicare Other

## 2021-02-12 ENCOUNTER — Other Ambulatory Visit: Payer: Self-pay

## 2021-02-12 DIAGNOSIS — Z87448 Personal history of other diseases of urinary system: Secondary | ICD-10-CM

## 2021-02-12 LAB — MICROSCOPIC EXAMINATION

## 2021-02-12 LAB — URINALYSIS, COMPLETE
Bilirubin, UA: NEGATIVE
Glucose, UA: NEGATIVE
Ketones, UA: NEGATIVE
Nitrite, UA: NEGATIVE
Protein,UA: NEGATIVE
RBC, UA: NEGATIVE
Specific Gravity, UA: 1.015 (ref 1.005–1.030)
Urobilinogen, Ur: 0.2 mg/dL (ref 0.2–1.0)
pH, UA: 7.5 (ref 5.0–7.5)

## 2021-12-16 ENCOUNTER — Other Ambulatory Visit: Payer: Self-pay | Admitting: Family Medicine

## 2021-12-16 DIAGNOSIS — Z1231 Encounter for screening mammogram for malignant neoplasm of breast: Secondary | ICD-10-CM

## 2021-12-17 ENCOUNTER — Other Ambulatory Visit: Payer: Self-pay

## 2021-12-17 ENCOUNTER — Ambulatory Visit
Admission: RE | Admit: 2021-12-17 | Discharge: 2021-12-17 | Disposition: A | Discharge status: Home / Self Care | Payer: Medicare Other | Source: Ambulatory Visit | Attending: Family Medicine | Admitting: Family Medicine

## 2021-12-17 DIAGNOSIS — Z1231 Encounter for screening mammogram for malignant neoplasm of breast: Secondary | ICD-10-CM | POA: Insufficient documentation

## 2022-01-14 ENCOUNTER — Ambulatory Visit: Payer: Medicare Other

## 2022-08-20 ENCOUNTER — Other Ambulatory Visit: Payer: Self-pay | Admitting: Student

## 2022-08-20 DIAGNOSIS — M76892 Other specified enthesopathies of left lower limb, excluding foot: Secondary | ICD-10-CM

## 2022-08-20 DIAGNOSIS — G8929 Other chronic pain: Secondary | ICD-10-CM

## 2022-08-20 DIAGNOSIS — S83282D Other tear of lateral meniscus, current injury, left knee, subsequent encounter: Secondary | ICD-10-CM

## 2022-09-02 ENCOUNTER — Ambulatory Visit
Admission: RE | Admit: 2022-09-02 | Discharge: 2022-09-02 | Disposition: A | Discharge status: Home / Self Care | Payer: Medicare Other | Source: Ambulatory Visit | Attending: Student | Admitting: Student

## 2022-09-02 DIAGNOSIS — G8929 Other chronic pain: Secondary | ICD-10-CM

## 2022-09-02 DIAGNOSIS — M76892 Other specified enthesopathies of left lower limb, excluding foot: Secondary | ICD-10-CM

## 2022-09-02 DIAGNOSIS — S83282D Other tear of lateral meniscus, current injury, left knee, subsequent encounter: Secondary | ICD-10-CM

## 2022-10-08 ENCOUNTER — Encounter: Payer: Self-pay | Admitting: Urology

## 2022-10-08 ENCOUNTER — Ambulatory Visit (INDEPENDENT_AMBULATORY_CARE_PROVIDER_SITE_OTHER): Payer: Medicare Other | Admitting: Urology

## 2022-10-08 VITALS — BP 126/77 | HR 66 | Ht 62.0 in | Wt 164.0 lb

## 2022-10-08 DIAGNOSIS — N3281 Overactive bladder: Secondary | ICD-10-CM

## 2022-10-08 DIAGNOSIS — N952 Postmenopausal atrophic vaginitis: Secondary | ICD-10-CM

## 2022-10-08 DIAGNOSIS — Z87440 Personal history of urinary (tract) infections: Secondary | ICD-10-CM

## 2022-10-08 DIAGNOSIS — Z87448 Personal history of other diseases of urinary system: Secondary | ICD-10-CM

## 2022-10-08 LAB — BLADDER SCAN AMB NON-IMAGING: Scan Result: 3

## 2022-10-08 MED ORDER — ESTRADIOL 0.1 MG/GM VA CREA: TOPICAL_CREAM | VAGINAL | 12 refills | Status: AC

## 2022-10-08 MED ORDER — ESTRADIOL 0.1 MG/GM VA CREA: TOPICAL_CREAM | VAGINAL | 12 refills | Status: DC

## 2022-10-08 MED ORDER — OXYBUTYNIN CHLORIDE 5 MG PO TABS: 5.0000 mg | ORAL_TABLET | Freq: Two times a day (BID) | ORAL | 1 refills | Status: DC

## 2022-10-08 NOTE — Progress Notes (Signed)
10/08/2022 5:10 PM   Susan Harding Oct 05, 1951 [id number]  Referring provider: Marisue Ivan, MD [phone] [facility] [address] [facility] [place],  [place] [address]  Chief Complaint  Patient presents with   Urinary Tract Infection    HPI: 71 year old female with a personal history of recurrent UTIs who presents today for reevaluation.  She was previously known to [place] last seen in 2020 for the same issue.  She is also had a negative CT urogram and cystoscopy in 2018.  She has a personal history of atrophic vaginitis and previously was on topical estrogen cream.  Most recently, she was seen and evaluated at urgent care on 09/23/2022 for possible UTI symptoms.  At the time, she was having urinary frequency along with lower abdominal pain and dysuria off and on for about 2 weeks.  She had been taking Pyridium.  Urinalysis at the time was nitrite positive with 5 WBCs but otherwise unremarkable.  She was treated presumptively with Cipro.  Associated urine culture was negative.  She has no other documented infections over the past several years.  She reports over the past year or so, she has had some urinary urgency frequency which comes and episodes.  This somewhat bothersome to her.  Sometimes she has difficulty getting to the bathroom in time.  She has not tried any OAB medications.  She has a personal history of this prior to over the past year.  She also reports that she has some extrinsic tingling and burning at times.  She denies any vaginal symptoms or bulging.  She denies any issues with constipation.   PMH: Past Medical History:  Diagnosis Date   Arthritis    Atrophic vaginitis    Erosive gastritis    Frequency    Fundic gland polyps of stomach, benign    GERD (gastroesophageal reflux disease)    History of bladder infections    History of recurrent UTIs    Meniere disease    Nystagmus    OAB (overactive bladder)    PONV (postoperative nausea and vomiting)     Pure hypercholesterolemia    Renal stones    Stress incontinence    Thyroid disease    Vertigo    Vitamin D deficiency     Surgical History: Past Surgical History:  Procedure Laterality Date   ABDOMINAL HYSTERECTOMY  2002   BREAST BIOPSY Right 2009   neg   BREAST EXCISIONAL BIOPSY Right 1970   neg   ESOPHAGOGASTRODUODENOSCOPY N/A 11/16/2015   Procedure: ESOPHAGOGASTRODUODENOSCOPY (EGD);  Surgeon: Christena Deem, MD;  Location: [facility] ENDOSCOPY;  Service: Endoscopy;  Laterality: N/A;   ESOPHAGOGASTRODUODENOSCOPY (EGD) WITH PROPOFOL N/A 08/18/2019   Procedure: ESOPHAGOGASTRODUODENOSCOPY (EGD) WITH PROPOFOL;  Surgeon: Christena Deem, MD;  Location: [facility] ENDOSCOPY;  Service: Endoscopy;  Laterality: N/A;   HIP ARTHROPLASTY     JOINT REPLACEMENT  06/2013   hip   maxillary sinusotomy intranasal     TONSILLECTOMY     TUBAL LIGATION      Home Medications:  Allergies as of 10/08/2022       Reactions   Codeine    Fluconazole Other (See Comments)   "caused brown spots to break out on her arms"        Medication List        Accurate as of October 08, 2022 11:59 PM. If you have any questions, ask your nurse or doctor.          STOP taking these medications  Lactobacillus Acidophilus Powd Stopped by: Vanna Scotland, MD   levothyroxine 100 MCG tablet Commonly known as: SYNTHROID Stopped by: Vanna Scotland, MD   Melatonin 10 MG Tabs Stopped by: Vanna Scotland, MD   omeprazole 20 MG capsule Commonly known as: PRILOSEC Stopped by: Vanna Scotland, MD   traMADol 50 MG tablet Commonly known as: ULTRAM Stopped by: Vanna Scotland, MD   triamterene-hydrochlorothiazide 37.5-25 MG capsule Commonly known as: DYAZIDE Stopped by: Vanna Scotland, MD   trimethoprim 100 MG tablet Commonly known as: TRIMPEX Stopped by: Vanna Scotland, MD       TAKE these medications    atorvastatin 80 MG tablet Commonly known as: LIPITOR Take by mouth.   Calcium  Carbonate-Vitamin D 600-400 MG-UNIT tablet Take 2 tablets by mouth daily.   estradiol 0.1 MG/GM vaginal cream Commonly known as: ESTRACE VAGINAL Apply 0.5mg  (pea-sized amount)  just inside the vaginal introitus with a finger-tip every night for two weeks and then Monday, Wednesday and Friday nights.   Multi-Vitamins Tabs Take by mouth.   OMEGA 3-6-9 FATTY ACIDS PO Take 1 capsule by mouth daily.   oxybutynin 5 MG tablet Commonly known as: DITROPAN Take 1 tablet (5 mg total) by mouth 2 (two) times daily. Started by: Vanna Scotland, MD   PRESERVISION AREDS 2 PO Take by mouth.        Allergies:  Allergies  Allergen Reactions   Codeine    Fluconazole Other (See Comments)    "caused brown spots to break out on her arms"    Family History: Family History  Problem Relation Age of Onset   Interstitial cystitis Daughter    Kidney disease Father    Hypertension Father    Hyperlipidemia Father    Brain cancer Mother    Gout Sister    Osteoarthritis Sister    Prostate cancer Neg Hx    Kidney cancer Neg Hx    Bladder Cancer Neg Hx    Breast cancer Neg Hx     Social History:  reports that she has never smoked. She has never used smokeless tobacco. She reports that she does not drink alcohol and does not use drugs.   Physical Exam: BP 126/77   Pulse 66   Ht 5\' 2"  (1.575 m)   Wt 164 lb (74.4 kg)   BMI 30.00 kg/m   Constitutional:  Alert and oriented, No acute distress. HEENT: [place] AT, moist mucus membranes.  Trachea midline, no masses. Cardiovascular: No clubbing, cyanosis, or edema. Respiratory: Normal respiratory effort, no increased work of breathing. Neurologic: Grossly intact, no focal deficits, moving all 4 extremities. Psychiatric: Normal mood and affect.  Laboratory Data: Lab Results  Component Value Date   WBC 8.9 06/17/2015   HGB 12.9 06/17/2015   HCT 38.2 06/17/2015   MCV 87.3 06/17/2015   PLT 333 06/17/2015    Lab Results  Component Value Date    CREATININE 0.71 06/17/2015    Urinalysis Results for orders placed or performed in visit on 10/08/22  Urinalysis, Complete  Result Value Ref Range   Specific Gravity, UA 1.015 1.005 - 1.030   pH, UA 7.0 5.0 - 7.5   Color, UA Yellow Yellow   Appearance Ur Clear Clear   Leukocytes,UA Negative Negative   Protein,UA Negative Negative/Trace   Glucose, UA Negative Negative   Ketones, UA Negative Negative   RBC, UA Negative Negative   Bilirubin, UA Negative Negative   Urobilinogen, Ur 0.2 0.2 - 1.0 mg/dL   Nitrite, UA Negative Negative  Bladder Scan (Post Void Residual) in office  Result Value Ref Range   Scan Result 3      Assessment & Plan:    1. OAB (overactive bladder) Urinary frequency and urgency without evidence of concomitant UTI  Suspect that she has an underlying component of urgency and OAB  She likely benefit from a medication for overactivity, we discussed trial of oxybutynin 10 mg XL, reassess in a few months.  We discussed possible side effects including dry eyes, dry mouth, and constipation.  She would like to try this medicine.  2. Atrophic vaginitis In the absence of irritation and personal history of atrophic vaginitis no longer using topical estrogen cream, I have strongly urged her to resume this which will likely help with her external symptoms  We also discussed the benefit of UTI prevention  We discussed risk and benefits.  We discussed using pea-sized amount per urethra 3 times a week.  She is agreeable this plan.  3. History of recurrent UTI (urinary tract infection) No evidence based on urinalysis that her symptoms were UTI related, see above - Urinalysis, Complete - Bladder Scan (Post Void Residual) in office   Follow-up 1 month to reassess symptoms/PVR  Vanna Scotland, MD  [facility] [address], [address] [place], [place] [address] [phone]

## 2022-10-09 LAB — URINALYSIS, COMPLETE
Bilirubin, UA: NEGATIVE
Glucose, UA: NEGATIVE
Ketones, UA: NEGATIVE
Leukocytes,UA: NEGATIVE
Nitrite, UA: NEGATIVE
Protein,UA: NEGATIVE
RBC, UA: NEGATIVE
Specific Gravity, UA: 1.015 (ref 1.005–1.030)
Urobilinogen, Ur: 0.2 mg/dL (ref 0.2–1.0)
pH, UA: 7 (ref 5.0–7.5)

## 2022-11-24 NOTE — Progress Notes (Signed)
11/25/2022 1:28 PM   Susan Harding [birth year] [id number]  Referring provider: Dion Body, MD [place] [facility] [place],  [place] [address]  Urological history: 1. High risk hematuria -non-smoker -CTU (2018) - no worrisome findings -cysto (2018) - NED -no reports of gross heme -UA (09/2022) negative for micro heme  2. rUTI's -contributing factors of age, vaginal atrophy and DM -documented urine cultures over the last year  09/23/2022 - MUF  01/02/2022 - <10,000 colonies   12/20/2021 - MUF  12/11/2021 - E.coli  -vaginal estrogen cream   3. OAB -contributing factors of age, vaginal atrophy and DM -PVR 12 mL -oxybutynin 5 mg daily BID  Chief Complaint  Patient presents with   Recurrent UTI    HPI: Susan Harding is a 72 y.o. female who presents today for follow up after a trial of oxybutynin.    She is having 8 or more daytime voids, 1-2 nocturia with a mild to strong urge to urinate.  She is having incontinence with both urge and stress.  She leaks 1-2 times weekly.  She wears panty liners daily and she does engage in toilet mapping.  Patient denies any modifying or aggravating factors.  Patient denies any gross hematuria, dysuria or suprapubic/flank pain.  Patient denies any fevers, chills, nausea or vomiting.    She has been applying the vaginal estrogen cream and taking oxybutynin IR 5 mg once daily.  She states she feels her symptoms are much improved.  She is experiencing some dry mouth, but she tolerates it and it is only bothersome in the morning.  PVR 12 mL   PMH: Past Medical History:  Diagnosis Date   Arthritis    Atrophic vaginitis    Erosive gastritis    Frequency    Fundic gland polyps of stomach, benign    GERD (gastroesophageal reflux disease)    History of bladder infections    History of recurrent UTIs    Meniere disease    Nystagmus    OAB (overactive bladder)    PONV (postoperative nausea and  vomiting)    Pure hypercholesterolemia    Renal stones    Stress incontinence    Thyroid disease    Vertigo    Vitamin D deficiency     Surgical History: Past Surgical History:  Procedure Laterality Date   ABDOMINAL HYSTERECTOMY  2002   BREAST BIOPSY Right 2009   neg   BREAST EXCISIONAL BIOPSY Right 1970   neg   ESOPHAGOGASTRODUODENOSCOPY N/A 11/16/2015   Procedure: ESOPHAGOGASTRODUODENOSCOPY (EGD);  Surgeon: Lollie Sails, MD;  Location: [facility] ENDOSCOPY;  Service: Endoscopy;  Laterality: N/A;   ESOPHAGOGASTRODUODENOSCOPY (EGD) WITH PROPOFOL N/A 08/18/2019   Procedure: ESOPHAGOGASTRODUODENOSCOPY (EGD) WITH PROPOFOL;  Surgeon: Lollie Sails, MD;  Location: [facility] ENDOSCOPY;  Service: Endoscopy;  Laterality: N/A;   HIP ARTHROPLASTY     JOINT REPLACEMENT  06/2013   hip   maxillary sinusotomy intranasal     TONSILLECTOMY     TUBAL LIGATION      Home Medications:  Allergies as of 11/25/2022       Reactions   Codeine    Fluconazole Other (See Comments)   "caused brown spots to break out on her arms"        Medication List        Accurate as of November 25, 2022  1:28 PM. If you have any questions, ask your nurse or doctor.  atorvastatin 80 MG tablet Commonly known as: LIPITOR Take by mouth.   Calcium Carbonate-Vitamin D 600-400 MG-UNIT tablet Take 2 tablets by mouth daily.   estradiol 0.1 MG/GM vaginal cream Commonly known as: ESTRACE VAGINAL Apply 0.'5mg'$  (pea-sized amount)  just inside the vaginal introitus with a finger-tip every night for two weeks and then Monday, Wednesday and Friday nights.   Multi-Vitamins Tabs Take by mouth.   OMEGA 3-6-9 FATTY ACIDS PO Take 1 capsule by mouth daily.   oxybutynin 5 MG tablet Commonly known as: DITROPAN Take 1 tablet (5 mg total) by mouth 2 (two) times daily.   PRESERVISION AREDS 2 PO Take by mouth.        Allergies:  Allergies  Allergen Reactions   Codeine    Fluconazole Other (See  Comments)    "caused brown spots to break out on her arms"    Family History: Family History  Problem Relation Age of Onset   Interstitial cystitis Daughter    Kidney disease Father    Hypertension Father    Hyperlipidemia Father    Brain cancer Mother    Gout Sister    Osteoarthritis Sister    Prostate cancer Neg Hx    Kidney cancer Neg Hx    Bladder Cancer Neg Hx    Breast cancer Neg Hx     Social History:  reports that she has never smoked. She has never used smokeless tobacco. She reports that she does not drink alcohol and does not use drugs.  ROS: Pertinent ROS in HPI  Physical Exam: BP 134/78   Pulse 77   Ht '5\' 2"'$  (1.575 m)   Wt 183 lb (83 kg)   BMI 33.47 kg/m   Constitutional:  Well nourished. Alert and oriented, No acute distress. HEENT: [place] AT, moist mucus membranes.  Trachea midline Cardiovascular: No clubbing, cyanosis, or edema. Respiratory: Normal respiratory effort, no increased work of breathing. Neurologic: Grossly intact, no focal deficits, moving all 4 extremities. Psychiatric: Normal mood and affect.    Laboratory Data: Serum creatinine (11/2022) 0.5 Hemoglobin A1c (11/2022) 5.8 I have reviewed the labs.   Pertinent Imaging:  11/25/22 13:02  Scan Result 12   Assessment & Plan:    1. OAB -Discussed the availability of other medications, Myrbetriq or Gemtesa, but since they are likely cost prohibitive she is fine with staying with the oxybutynin IR -Continue oxybutynin IR 5 mg daily  2.  Vaginal atrophy -Discussed the pathophysiology of vaginal atrophy -Continue vaginal estrogen cream 3 nights weekly    Return in about 6 months (around 05/26/2023) for PVR and OAB questionnaire.  These notes generated with voice recognition software. I apologize for typographical errors.  [place], [place] [address]  [place], [place] [address] [phone]

## 2022-11-25 ENCOUNTER — Encounter: Payer: Self-pay | Admitting: Urology

## 2022-11-25 ENCOUNTER — Other Ambulatory Visit: Payer: Self-pay | Admitting: Surgery

## 2022-11-25 ENCOUNTER — Ambulatory Visit (INDEPENDENT_AMBULATORY_CARE_PROVIDER_SITE_OTHER): Payer: Medicare Other | Admitting: Urology

## 2022-11-25 VITALS — BP 134/78 | HR 77 | Ht 62.0 in | Wt 183.0 lb

## 2022-11-25 DIAGNOSIS — N3281 Overactive bladder: Secondary | ICD-10-CM | POA: Diagnosis not present

## 2022-11-25 DIAGNOSIS — N952 Postmenopausal atrophic vaginitis: Secondary | ICD-10-CM | POA: Diagnosis not present

## 2022-11-25 LAB — BLADDER SCAN AMB NON-IMAGING: Scan Result: 12

## 2022-11-25 MED ORDER — OXYBUTYNIN CHLORIDE 5 MG PO TABS: 5.0000 mg | ORAL_TABLET | Freq: Two times a day (BID) | ORAL | 3 refills | Status: DC

## 2022-12-02 ENCOUNTER — Encounter
Admission: RE | Admit: 2022-12-02 | Discharge: 2022-12-02 | Disposition: A | Discharge status: Home / Self Care | Payer: Medicare Other | Source: Ambulatory Visit | Attending: Surgery | Admitting: Surgery

## 2022-12-02 ENCOUNTER — Other Ambulatory Visit: Payer: Self-pay

## 2022-12-02 ENCOUNTER — Encounter: Payer: Self-pay | Admitting: Surgery

## 2022-12-02 DIAGNOSIS — E785 Hyperlipidemia, unspecified: Secondary | ICD-10-CM | POA: Insufficient documentation

## 2022-12-02 DIAGNOSIS — Z01812 Encounter for preprocedural laboratory examination: Secondary | ICD-10-CM | POA: Insufficient documentation

## 2022-12-02 LAB — CBC
HCT: 38.2 % (ref 36.0–46.0)
Hemoglobin: 12.4 g/dL (ref 12.0–15.0)
MCH: 29.7 pg (ref 26.0–34.0)
MCHC: 32.5 g/dL (ref 30.0–36.0)
MCV: 91.4 fL (ref 80.0–100.0)
Platelets: 328 10*3/uL (ref 150–400)
RBC: 4.18 MIL/uL (ref 3.87–5.11)
RDW: 13.1 % (ref 11.5–15.5)
WBC: 7.5 10*3/uL (ref 4.0–10.5)
nRBC: 0 % (ref 0.0–0.2)

## 2022-12-02 NOTE — Patient Instructions (Signed)
Your procedure is scheduled on: 12/10/22 Report to [place]. To find out your arrival time please call [phone] between 1PM - 3PM on 12/09/22.  Remember: Instructions that are not followed completely may result in serious medical risk, up to and including death, or upon the discretion of your surgeon and anesthesiologist your surgery may need to be rescheduled.     _X__ 1. Do not eat food after midnight the night before your procedure.                 No gum chewing or hard candies. You may drink clear liquids up to 2 hours                 before you are scheduled to arrive for your surgery- DO not drink clear                 liquids within 2 hours of the start of your surgery.                 Clear Liquids include:  water, apple juice without pulp, clear carbohydrate                 drink such as Clearfast or Gatorade, Black Coffee or Tea (Do not add                 anything to coffee or tea). Diabetics water only  Drink the Ensure "clear" pre surgery drink 2 hours prior to arrival for surgery  __X__2.  On the morning of surgery brush your teeth with toothpaste and water, you                 may rinse your mouth with mouthwash if you wish.  Do not swallow any              toothpaste of mouthwash.     _X__ 3.  No Alcohol for 24 hours before or after surgery.   _X__ 4.  Do Not Smoke or use e-cigarettes For 24 Hours Prior to Your Surgery.                 Do not use any chewable tobacco products for at least 6 hours prior to                 surgery.  ____  5.  Bring all medications with you on the day of surgery if instructed.   __X__  6.  Notify your doctor if there is any change in your medical condition      (cold, fever, infections).     Do not wear jewelry, make-up, hairpins, clips or nail polish. Do not wear lotions, powders, or perfumes. You may wear deodorant. Do not shave body hair 48 hours prior to surgery. Men may shave  face and neck. Do not bring valuables to the hospital.    [facility] is not responsible for any belongings or valuables.  Contacts, dentures/partials or body piercings may not be worn into surgery. Bring a case for your contacts, glasses or hearing aids, a denture cup will be supplied. Leave your suitcase in the car. After surgery it may be brought to your room. For patients admitted to the hospital, discharge time is determined by your treatment team.   Patients discharged the day of surgery will need an adult driver. You will not be allowed to drive yourself home, take an uber, lyft or taxi.  You may use medical transportation such as [place] as long as you have an adult with you or waiting to assist you upon your arrival home. You must have an adult over 47 years old in the home with you for the first 24 hours after surgery   Please read over the following fact sheets that you were given:   CHG soap, Ensure, Incentive Spirometer  __X__ Take these medicines the morning of surgery with A SIP OF WATER:    1. levothyroxine (SYNTHROID) 112 MCG tablet   2. oxybutynin (DITROPAN) 5 MG tablet   3.   4.  5.  6.  ____ Fleet Enema (as directed)   __X__ Use CHG Soap/SAGE wipes as directed  ____ Use inhalers on the day of surgery  ____ Stop metformin/Janumet/Farxiga 2 days prior to surgery    ____ Take 1/2 of usual insulin dose the night before surgery. No insulin the morning          of surgery.   ____ Stop Blood Thinners Coumadin/Plavix/Xarelto/Pleta/Pradaxa/Eliquis/Effient/Aspirin  on   Or contact your Surgeon, Cardiologist or Medical Doctor regarding  ability to stop your blood thinners  __X__ Stop Anti-inflammatories 7 days before surgery such as Advil, Ibuprofen, Motrin,  BC or Goodies Powder, Naprosyn, Naproxen, Aleve, Aspirin You may substitute Tylenol if needed   __X__ Stop all herbals and supplements, fish oil or vitamins for 7 days, until after surgery.    ____  Bring C-Pap to the hospital.       Preparing for Surgery with CHLORHEXIDINE GLUCONATE (CHG) Soap  Chlorhexidine Gluconate (CHG) Soap  o An antiseptic cleaner that kills germs and bonds with the skin to continue killing germs even after washing  o Used for showering the night before surgery and morning of surgery  Before surgery, you can play an important role by reducing the number of germs on your skin.  CHG (Chlorhexidine gluconate) soap is an antiseptic cleanser which kills germs and bonds with the skin to continue killing germs even after washing.  Please do not use if you have an allergy to CHG or antibacterial soaps. If your skin becomes reddened/irritated stop using the CHG.  1. Shower the NIGHT BEFORE SURGERY and the MORNING OF SURGERY with CHG soap.  2. If you choose to wash your hair, wash your hair first as usual with your normal shampoo.  3. After shampooing, rinse your hair and body thoroughly to remove the shampoo.  4. Use CHG as you would any other liquid soap. You can apply CHG directly to the skin and wash gently with a scrungie or a clean washcloth.  5. Apply the CHG soap to your body only from the neck down. Do not use on open wounds or open sores. Avoid contact with your eyes, ears, mouth, and genitals (private parts). Wash face and genitals (private parts) with your normal soap.  6. Wash thoroughly, paying special attention to the area where your surgery will be performed.  7. Thoroughly rinse your body with warm water.  8. Do not shower/wash with your normal soap after using and rinsing off the CHG soap.  9. Pat yourself dry with a clean towel.  10. Wear clean pajamas to bed the night before surgery.  12. Place clean sheets on your bed the night of your first shower and do not sleep with pets.  13. Shower again with the CHG soap on the day of surgery prior to arriving at the hospital.  14. Do not apply  any deodorants/lotions/powders.  15. Please wear  clean clothes to the hospital.

## 2022-12-09 ENCOUNTER — Telehealth: Payer: Self-pay | Admitting: *Deleted

## 2022-12-09 NOTE — Telephone Encounter (Signed)
Pt calling stating that the oxybutynin is causing dry mouth and she would like to try something else. Please advise

## 2022-12-10 ENCOUNTER — Ambulatory Visit: Payer: Medicare Other | Admitting: Urgent Care

## 2022-12-10 ENCOUNTER — Ambulatory Visit
Admission: RE | Admit: 2022-12-10 | Discharge: 2022-12-10 | Disposition: A | Discharge status: Home / Self Care | Payer: Medicare Other | Attending: Surgery | Admitting: Surgery

## 2022-12-10 ENCOUNTER — Encounter
Admission: RE | Disposition: A | Discharge status: Home / Self Care | Payer: Self-pay | Source: Home / Self Care | Attending: Surgery

## 2022-12-10 ENCOUNTER — Encounter: Payer: Self-pay | Admitting: Surgery

## 2022-12-10 ENCOUNTER — Other Ambulatory Visit: Payer: Self-pay

## 2022-12-10 ENCOUNTER — Ambulatory Visit: Payer: Medicare Other | Admitting: Certified Registered"

## 2022-12-10 DIAGNOSIS — X58XXXA Exposure to other specified factors, initial encounter: Secondary | ICD-10-CM | POA: Diagnosis not present

## 2022-12-10 DIAGNOSIS — K219 Gastro-esophageal reflux disease without esophagitis: Secondary | ICD-10-CM | POA: Insufficient documentation

## 2022-12-10 DIAGNOSIS — E119 Type 2 diabetes mellitus without complications: Secondary | ICD-10-CM | POA: Diagnosis not present

## 2022-12-10 DIAGNOSIS — E7800 Pure hypercholesterolemia, unspecified: Secondary | ICD-10-CM | POA: Diagnosis not present

## 2022-12-10 DIAGNOSIS — E039 Hypothyroidism, unspecified: Secondary | ICD-10-CM | POA: Insufficient documentation

## 2022-12-10 DIAGNOSIS — M1712 Unilateral primary osteoarthritis, left knee: Secondary | ICD-10-CM | POA: Diagnosis present

## 2022-12-10 DIAGNOSIS — H8109 Meniere's disease, unspecified ear: Secondary | ICD-10-CM | POA: Diagnosis not present

## 2022-12-10 DIAGNOSIS — M94262 Chondromalacia, left knee: Secondary | ICD-10-CM | POA: Diagnosis not present

## 2022-12-10 DIAGNOSIS — N3281 Overactive bladder: Secondary | ICD-10-CM

## 2022-12-10 DIAGNOSIS — S83232A Complex tear of medial meniscus, current injury, left knee, initial encounter: Secondary | ICD-10-CM | POA: Diagnosis not present

## 2022-12-10 HISTORY — DX: Prediabetes: R73.03

## 2022-12-10 HISTORY — PX: KNEE ARTHROSCOPY: SHX127

## 2022-12-10 SURGERY — ARTHROSCOPY, KNEE
Anesthesia: General | Site: Knee | Laterality: Left

## 2022-12-10 MED ORDER — HYDROCODONE-ACETAMINOPHEN 5-325 MG PO TABS
ORAL_TABLET | ORAL | Status: AC
  Filled 2022-12-10: qty 1

## 2022-12-10 MED ORDER — ORAL CARE MOUTH RINSE: 15.0000 mL | Freq: Once | OROMUCOSAL | Status: AC

## 2022-12-10 MED ORDER — PROPOFOL 1000 MG/100ML IV EMUL
INTRAVENOUS | Status: AC
  Filled 2022-12-10: qty 100

## 2022-12-10 MED ORDER — FAMOTIDINE 20 MG PO TABS: 20.0000 mg | ORAL_TABLET | Freq: Once | ORAL | Status: AC

## 2022-12-10 MED ORDER — KETOROLAC TROMETHAMINE 30 MG/ML IJ SOLN
INTRAMUSCULAR | Status: DC | PRN
  Administered 2022-12-10: 15 mg via INTRAVENOUS

## 2022-12-10 MED ORDER — ONDANSETRON HCL 4 MG/2ML IJ SOLN
INTRAMUSCULAR | Status: DC | PRN
  Administered 2022-12-10: 4 mg via INTRAVENOUS

## 2022-12-10 MED ORDER — FENTANYL CITRATE (PF) 100 MCG/2ML IJ SOLN
INTRAMUSCULAR | Status: AC
  Filled 2022-12-10: qty 2

## 2022-12-10 MED ORDER — CHLORHEXIDINE GLUCONATE 0.12 % MT SOLN: 15.0000 mL | Freq: Once | OROMUCOSAL | Status: AC

## 2022-12-10 MED ORDER — FENTANYL CITRATE (PF) 100 MCG/2ML IJ SOLN
INTRAMUSCULAR | Status: DC | PRN
  Administered 2022-12-10: 50 ug via INTRAVENOUS
  Administered 2022-12-10 (×2): 25 ug via INTRAVENOUS

## 2022-12-10 MED ORDER — BUPIVACAINE-EPINEPHRINE (PF) 0.5% -1:200000 IJ SOLN
INTRAMUSCULAR | Status: AC
  Filled 2022-12-10: qty 60

## 2022-12-10 MED ORDER — PROPOFOL 10 MG/ML IV BOLUS
INTRAVENOUS | Status: DC | PRN
  Administered 2022-12-10: 120 mg via INTRAVENOUS
  Administered 2022-12-10: 120 ug/kg/min via INTRAVENOUS

## 2022-12-10 MED ORDER — ACETAMINOPHEN 10 MG/ML IV SOLN
INTRAVENOUS | Status: AC
  Filled 2022-12-10: qty 100

## 2022-12-10 MED ORDER — ONDANSETRON HCL 4 MG/2ML IJ SOLN: 4.0000 mg | Freq: Once | INTRAMUSCULAR | Status: DC | PRN

## 2022-12-10 MED ORDER — SODIUM CHLORIDE 0.9 % IV SOLN: INTRAVENOUS | Status: DC

## 2022-12-10 MED ORDER — RINGERS IRRIGATION IR SOLN
Status: DC | PRN
  Administered 2022-12-10: 2500 mL

## 2022-12-10 MED ORDER — BUPIVACAINE-EPINEPHRINE (PF) 0.5% -1:200000 IJ SOLN
INTRAMUSCULAR | Status: DC | PRN
  Administered 2022-12-10: 30 mL

## 2022-12-10 MED ORDER — CEFAZOLIN SODIUM-DEXTROSE 2-4 GM/100ML-% IV SOLN: 2.0000 g | INTRAVENOUS | Status: DC

## 2022-12-10 MED ORDER — METOCLOPRAMIDE HCL 5 MG/ML IJ SOLN: 5.0000 mg | Freq: Three times a day (TID) | INTRAMUSCULAR | Status: DC | PRN

## 2022-12-10 MED ORDER — CEFAZOLIN SODIUM-DEXTROSE 2-4 GM/100ML-% IV SOLN
INTRAVENOUS | Status: AC
  Filled 2022-12-10: qty 100

## 2022-12-10 MED ORDER — PHENYLEPHRINE HCL-NACL 20-0.9 MG/250ML-% IV SOLN
INTRAVENOUS | Status: AC
  Filled 2022-12-10: qty 250

## 2022-12-10 MED ORDER — FENTANYL CITRATE (PF) 100 MCG/2ML IJ SOLN
INTRAMUSCULAR | Status: AC
  Administered 2022-12-10: 25 ug via INTRAVENOUS
  Filled 2022-12-10: qty 2

## 2022-12-10 MED ORDER — PHENYLEPHRINE HCL (PRESSORS) 10 MG/ML IV SOLN
INTRAVENOUS | Status: DC | PRN
  Administered 2022-12-10: 160 ug via INTRAVENOUS
  Administered 2022-12-10: 80 ug via INTRAVENOUS
  Administered 2022-12-10: 160 ug via INTRAVENOUS
  Administered 2022-12-10: 80 ug via INTRAVENOUS
  Administered 2022-12-10: 160 ug via INTRAVENOUS

## 2022-12-10 MED ORDER — CEFAZOLIN SODIUM-DEXTROSE 2-3 GM-%(50ML) IV SOLR
INTRAVENOUS | Status: DC | PRN
  Administered 2022-12-10: 2 g via INTRAVENOUS

## 2022-12-10 MED ORDER — FAMOTIDINE 20 MG PO TABS
ORAL_TABLET | ORAL | Status: AC
  Administered 2022-12-10: 20 mg via ORAL
  Filled 2022-12-10: qty 1

## 2022-12-10 MED ORDER — HYDROMORPHONE HCL 1 MG/ML IJ SOLN
INTRAMUSCULAR | Status: DC | PRN
  Administered 2022-12-10: .3 mg via INTRAVENOUS
  Administered 2022-12-10: .2 mg via INTRAVENOUS

## 2022-12-10 MED ORDER — HYDROCODONE-ACETAMINOPHEN 5-325 MG PO TABS
1.0000 | ORAL_TABLET | ORAL | Status: DC | PRN
  Administered 2022-12-10: 1 via ORAL

## 2022-12-10 MED ORDER — OXYBUTYNIN CHLORIDE 5 MG PO TABS: 5.0000 mg | ORAL_TABLET | Freq: Every day | ORAL | Status: DC

## 2022-12-10 MED ORDER — METOCLOPRAMIDE HCL 10 MG PO TABS: 5.0000 mg | ORAL_TABLET | Freq: Three times a day (TID) | ORAL | Status: DC | PRN

## 2022-12-10 MED ORDER — LIDOCAINE HCL (PF) 1 % IJ SOLN
INTRAMUSCULAR | Status: DC | PRN
  Administered 2022-12-10: 60 mL via INTRAMUSCULAR

## 2022-12-10 MED ORDER — LACTATED RINGERS IV SOLN: INTRAVENOUS | Status: DC | PRN

## 2022-12-10 MED ORDER — LIDOCAINE HCL (PF) 1 % IJ SOLN
INTRAMUSCULAR | Status: AC
  Filled 2022-12-10: qty 30

## 2022-12-10 MED ORDER — DEXAMETHASONE SODIUM PHOSPHATE 10 MG/ML IJ SOLN
INTRAMUSCULAR | Status: DC | PRN
  Administered 2022-12-10: 5 mg via INTRAVENOUS

## 2022-12-10 MED ORDER — LIDOCAINE HCL (CARDIAC) PF 100 MG/5ML IV SOSY
PREFILLED_SYRINGE | INTRAVENOUS | Status: DC | PRN
  Administered 2022-12-10: 80 mg via INTRAVENOUS

## 2022-12-10 MED ORDER — FENTANYL CITRATE (PF) 100 MCG/2ML IJ SOLN
25.0000 ug | INTRAMUSCULAR | Status: DC | PRN
  Administered 2022-12-10 (×2): 25 ug via INTRAVENOUS

## 2022-12-10 MED ORDER — ACETAMINOPHEN 10 MG/ML IV SOLN
INTRAVENOUS | Status: DC | PRN
  Administered 2022-12-10: 1000 mg via INTRAVENOUS

## 2022-12-10 MED ORDER — CHLORHEXIDINE GLUCONATE 0.12 % MT SOLN
OROMUCOSAL | Status: AC
  Administered 2022-12-10: 15 mL via OROMUCOSAL
  Filled 2022-12-10: qty 15

## 2022-12-10 MED ORDER — PROPOFOL 10 MG/ML IV BOLUS
INTRAVENOUS | Status: AC
  Filled 2022-12-10: qty 40

## 2022-12-10 MED ORDER — ONDANSETRON HCL 4 MG PO TABS: 4.0000 mg | ORAL_TABLET | Freq: Four times a day (QID) | ORAL | Status: DC | PRN

## 2022-12-10 MED ORDER — LACTATED RINGERS IV SOLN: INTRAVENOUS | Status: DC

## 2022-12-10 MED ORDER — ONDANSETRON HCL 4 MG/2ML IJ SOLN: 4.0000 mg | Freq: Four times a day (QID) | INTRAMUSCULAR | Status: DC | PRN

## 2022-12-10 MED ORDER — HYDROMORPHONE HCL 1 MG/ML IJ SOLN
INTRAMUSCULAR | Status: AC
  Filled 2022-12-10: qty 1

## 2022-12-10 MED ORDER — HYDROCODONE-ACETAMINOPHEN 5-325 MG PO TABS: 1.0000 | ORAL_TABLET | Freq: Four times a day (QID) | ORAL | 0 refills | Status: DC | PRN

## 2022-12-10 SURGICAL SUPPLY — 41 items
BLADE FULL RADIUS 3.5 (BLADE) ×1 IMPLANT
BLADE SHAVER 4.5X7 STR FR (MISCELLANEOUS) ×1 IMPLANT
BNDG ELASTIC 6X5.8 VLCR STR LF (GAUZE/BANDAGES/DRESSINGS) ×1 IMPLANT
BNDG ESMARCH 6 X 12 STRL LF (GAUZE/BANDAGES/DRESSINGS) ×1
BNDG ESMARCH 6X12 STRL LF (GAUZE/BANDAGES/DRESSINGS) ×1 IMPLANT
CATH ROBINSON RED A/P 12FR (CATHETERS) IMPLANT
CHLORAPREP W/TINT 26 (MISCELLANEOUS) ×1 IMPLANT
COLLECTOR GRAFT TISSUE (SYSTAGENIX WOUND MANAGEMENT) ×1
CUFF TOURN SGL QUICK 24 (TOURNIQUET CUFF)
CUFF TOURN SGL QUICK 34 (TOURNIQUET CUFF)
CUFF TRNQT CYL 24X4X16.5-23 (TOURNIQUET CUFF) IMPLANT
CUFF TRNQT CYL 34X4.125X (TOURNIQUET CUFF) IMPLANT
CUP MEDICINE 2OZ PLAST GRAD ST (MISCELLANEOUS) ×1 IMPLANT
DRAPE ARTHRO LIMB 89X125 STRL (DRAPES) ×1 IMPLANT
DRAPE IMP U-DRAPE 54X76 (DRAPES) ×1 IMPLANT
ELECT REM PT RETURN 9FT ADLT (ELECTROSURGICAL) ×1
ELECTRODE REM PT RTRN 9FT ADLT (ELECTROSURGICAL) ×1 IMPLANT
GAUZE SPONGE 4X4 12PLY STRL (GAUZE/BANDAGES/DRESSINGS) ×1 IMPLANT
GLOVE BIO SURGEON STRL SZ8 (GLOVE) ×2 IMPLANT
GLOVE SURG UNDER LTX SZ8 (GLOVE) ×1 IMPLANT
GOWN STRL REUS W/ TWL LRG LVL3 (GOWN DISPOSABLE) ×1 IMPLANT
GOWN STRL REUS W/ TWL XL LVL3 (GOWN DISPOSABLE) ×2 IMPLANT
GOWN STRL REUS W/TWL LRG LVL3 (GOWN DISPOSABLE) ×1
GOWN STRL REUS W/TWL XL LVL3 (GOWN DISPOSABLE) ×2
IV LACTATED RINGER IRRG 3000ML (IV SOLUTION) ×1
IV LR IRRIG 3000ML ARTHROMATIC (IV SOLUTION) ×1 IMPLANT
KIT TURNOVER KIT A (KITS) ×1 IMPLANT
MANIFOLD NEPTUNE II (INSTRUMENTS) ×2 IMPLANT
NDL HYPO 21X1.5 SAFETY (NEEDLE) ×1 IMPLANT
NEEDLE HYPO 21X1.5 SAFETY (NEEDLE) ×1 IMPLANT
PACK ARTHROSCOPY KNEE (MISCELLANEOUS) ×1 IMPLANT
SLEEVE REMOTE CONTROL 5X12 (DRAPES) IMPLANT
SUT PROLENE 4 0 PS 2 18 (SUTURE) ×1 IMPLANT
SYR 30ML LL (SYRINGE) ×1 IMPLANT
SYR 50ML LL SCALE MARK (SYRINGE) ×1 IMPLANT
SYR TOOMEY 50ML (SYRINGE) IMPLANT
TISSUE GRAFT COLLECTOR (SYSTAGENIX WOUND MANAGEMENT) IMPLANT
TRAP FLUID SMOKE EVACUATOR (MISCELLANEOUS) ×1 IMPLANT
TUBING INFLOW SET DBFLO PUMP (TUBING) ×1 IMPLANT
WAND WEREWOLF FLOW 90D (MISCELLANEOUS) ×1 IMPLANT
WATER STERILE IRR 500ML POUR (IV SOLUTION) ×1 IMPLANT

## 2022-12-10 NOTE — Op Note (Addendum)
12/10/2022  11:39 AM  Patient:   Susan Harding  Pre-Op Diagnosis:   Complex medial meniscus tear with underlying degenerative joint disease, left knee.  Postoperative diagnosis:   Complex medial meniscus tear, extensive synovitis with symptomatic plica, and degenerative joint disease, left knee.  Procedure:   Extensive arthroscopic synovectomy with debridement of plica, partial medial meniscectomy, abrasion chondroplasty of femoral trochlea, and reinjection of harvested infrapatellar fat cells, left knee.  Surgeon:   Pascal Lux, MD  Anesthesia:   General LMA  Findings:   As above.  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  Grade II-III chondromalacia changes of the patella also were noted.  The lateral compartment appeared to be well-preserved.  There was mild degenerative fraying of the lateral meniscus centrally.  The anterior and posterior cruciate ligaments both are in satisfactory condition.  Complications:   None  EBL:   5 cc.  Total fluids:   500 cc of crystalloid.  Tourniquet time:   None  Drains:   None  Closure:   4-0 Prolene interrupted sutures.  Brief clinical note:   The patient is a 72 year old female with a 1 year history of progressively worsening medial sided left knee pain. Her symptoms have progressed despite medications, activity modification, injections, etc. Her history and examination are consistent with degenerative joint disease as well as a medial meniscus tear, both of which were confirmed by preoperative MRI scanning. The patient presents at this time for arthroscopy, debridement, and partial medial meniscectomy.  Procedure:   The patient was brought into the operating room and lain in the supine position. After adequate general laryngeal mask anesthesia was obtained, a timeout was  performed to verify the appropriate side. The patient's left knee was injected sterilely using a solution of 30 cc of 1% lidocaine and 30 cc of 0.5% Sensorcaine with epinephrine. The left lower extremity was prepped with ChloraPrep solution before being draped sterilely. Preoperative antibiotics were administered. The expected portal sites were injected with 0.5% Sensorcaine with epinephrine before the camera was placed in the anterolateral portal and instrumentation performed through the anteromedial portal.   The knee was sequentially examined beginning in the suprapatellar pouch, then progressing to the patellofemoral space, the medial gutter and compartment, the notch, and finally the lateral compartment and gutter. The findings were as described above. Abundant reactive synovial tissues anteriorly medially, laterally, and superiorly were debrided using the full-radius resector. This included a thickened supero-medial shelf plica. The infrapatellar fat tissue was harvested using the Arthrex graft net device and collected on the back table for later reinsertion into the joint.   The medial meniscus was carefully probed.  There was a large meniscal fragment that had flipped inferiorly.  This was reduced and the unstable portion of the medial meniscus debrided back to stable margins using combination of the mini-munchers, the side-biting baskets, and the full-radius resector. Subsequent probing of the remaining meniscal rim demonstrated excellent stability. Areas of grade III andIV chondromalacia changes on the medial femoral condyle and femoral trochlea were debrided back to stable margins using the full-radius resector. The lateral meniscus was probed and found to be stable.  The instruments were removed from the joint after suctioning the excess fluid.   At this point, the harvested fat cells were reinjected into the knee joint using a Toomey syringe and red rubber catheter which was placed through  a metal  trocar.  The portal sites were closed using 4-0 Prolene interrupted sutures before a sterile bulky dressing was applied to the knee. The patient was then awakened, extubated, and returned to the recovery room in satisfactory condition after tolerating the procedure well.

## 2022-12-10 NOTE — Discharge Instructions (Addendum)
AMBULATORY SURGERY  DISCHARGE INSTRUCTIONS   The drugs that you were given will stay in your system until tomorrow so for the next 24 hours you should not:  Drive an automobile Make any legal decisions Drink any alcoholic beverage   You may resume regular meals tomorrow.  Today it is better to start with liquids and gradually work up to solid foods.  You may eat anything you prefer, but it is better to start with liquids, then soup and crackers, and gradually work up to solid foods.   Please notify your doctor immediately if you have any unusual bleeding, trouble breathing, redness and pain at the surgery site, drainage, fever, or pain not relieved by medication.     Additional Instructions:   Orthopedic discharge instructions: Keep dressing dry and intact.  May shower after dressing changed on post-op day #4 (Sunday).  Cover staples/sutures with Band-Aids after drying off. Apply ice frequently to knee. Take ibuprofen 600-800 mg TID with meals for 3-5 days, then as necessary. Take pain medication as prescribed or ES Tylenol when needed.  May weight-bear as tolerated - use crutches or walker as needed. Follow-up in 10-14 days or as scheduled.

## 2022-12-10 NOTE — Transfer of Care (Signed)
Immediate Anesthesia Transfer of Care Note  Patient: Susan Harding  Procedure(s) Performed: ARTHROSCOPY KNEE WITH DEBRIDEMENT AND REPAIR VERSUS PARTIAL MEDIAL MENISCECTOMY. (Left: Knee)  Patient Location: PACU  Anesthesia Type:General  Level of Consciousness: awake  Airway & Oxygen Therapy: Patient Spontanous Breathing and Patient connected to face mask oxygen  Post-op Assessment: Report given to RN and Post -op Vital signs reviewed and stable  Post vital signs: Reviewed  Last Vitals:  Vitals Value Taken Time  BP 115/60 12/10/22 1000  Temp 36.3 C 12/10/22 0955  Pulse 69 12/10/22 1000  Resp 9 12/10/22 1000  SpO2 99 % 12/10/22 1000  Vitals shown include unvalidated device data.  Last Pain:  Vitals:   12/10/22 0731  TempSrc: Temporal  PainSc: 2          Complications: No notable events documented.

## 2022-12-10 NOTE — H&P (Signed)
History of Present Illness: Susan Harding is a 72 y.o. female who presents today for her surgical history and physical for upcoming left knee arthroscopy with debridement and repair versus partial medial meniscectomy. Surgery is scheduled with Dr. Roland Rack on 12/10/2022. Overall the patient feels that she is doing well at today's visit, she does report a 3 out of 10 pain score at today's appointment. The patient has been decreasing her activity level and has not been doing any activities that bother the left knee. The patient does have a history of Mnire's disease and was recently started on a prednisone taper which she will be taking leading up to the date of surgery. The patient denies any personal history of heart attack, stroke, asthma or COPD. The patient does have a history of diabetes, she is a prediabetic most recent A1c was 5.8. The patient denies any history of blood clots. She has not suffered any repeat injury or trauma affecting left knee since her last evaluation.  Past Medical History: Arthritis  Osteoarthritis  Borderline diabetes  BPPV (benign paroxysmal positional vertigo)  Diabetes mellitus type 2, uncomplicated (CMS-HCC) - pre diabetic (no medications)  Erosive gastritis 11/16/2015  Fundic gland polyps of stomach, benign 11/16/2015  GERD (gastroesophageal reflux disease)  Hearing loss  Chronic hearing loss- followed by Dr. Tami Ribas  History of hematuria 02/10/2017  History of recurrent UTIs (Followed by Dr. Elnoria Howard)  Osteoarthritis 11/2017  PONV (postoperative nausea and vomiting)  Pure hypercholesterolemia (LDL 68 - 06/08/18)  Thyroid disease  Urinary frequency (Followed by Dr. Elnoria Howard)   Past Surgical History: [place] 2004  With BSO  ARTHROPLASTY HIP TOTAL Right 06/28/2013  Procedure: ARTHROPLASTY HIP TOTAL; Surgeon: Ileene Patrick, MD; Location: [place]; Service: Orthopedics; Laterality: Right;  EGD 11/16/2015  Erosive gastritis/Fundic Gland  Polyps/Repeat 52yrto recheck GEJ/MUS  ARTHROPLASTY HIP TOTAL Left 06/21/2018  Procedure: LEFT TOTAL HIP REPLACEMENT; Surgeon: KIleene Patrick MD; Location: [place] Service: Orthopedics; Laterality: Left;  EGD 08/18/2019 (GERD/Repeat 3 months/MUS)  JOINT REPLACEMENT  MAXILLARY SINUSOTOMY INTRANASAL  TUBAL LIGATION   Past Family History: Kidney disease Father  High blood pressure (Hypertension) Father  Hyperlipidemia (Elevated cholesterol) Father  Obesity Father  Gout Sister  Other Sister  Small bowel cancer  Osteoarthritis Sister  Brain cancer Mother  Thyroid disease Mother  Osteoarthritis Sister   Medications: acetaminophen (TYLENOL) 500 MG tablet Take 1,000 mg by mouth every 8 (eight) hours as needed  atorvastatin (LIPITOR) 80 MG tablet Take 20 mg by mouth nightly. Takes 1/4 tablet nightly calcium carbonate-vitamin D3 (CALTRATE 600+D) 600 mg-10 mcg (400 unit) tablet Take 2 tablets by mouth once daily.  cyanocobalamin (VITAMIN B12) 1000 MCG tablet Take 1,000 mcg by mouth once daily  diclofenac (VOLTAREN) 1 % topical gel Apply 2 g topically 3 (three) times daily 100 g 2  estradioL (ESTRACE) 0.01 % (0.1 mg/gram) vaginal cream Apply 0.'5mg'$  (pea-sized amount) just inside the vaginal introitus with a finger-tip every night for two weeks and then Monday, Wednesday and Friday nights.  levothyroxine (SYNTHROID) 112 MCG tablet TAKE 1 TABLET BY MOUTH ONCE DAILY. TAKE ON EMPTY STOMACH WITH A GLASS OF WATER AT LEAST 30-60 MINUTES BEFORE BREAKFAST 90 tablet 1  multivitamin tablet Take 1 tablet by mouth once daily.  naproxen sodium (ALEVE) 220 MG tablet Take 1 tablet (220 mg total) by mouth 2 (two) times daily as needed for Pain  oxyBUTYnin (DITROPAN) 5 mg tablet Take 5 mg by mouth 2 (two) times daily  predniSONE (DELTASONE)  10 MG tablet Take 10 mg by mouth once daily 12 days course  VIT A/VIT C/VIT E/ZINC/COPPER (PRESERVISION AREDS ORAL) Take 2 tablets by mouth once daily.   melatonin 5  mg Tab Take 5 mg by mouth nightly. (Patient not taking: Reported on 12/02/2022)  omeprazole (PRILOSEC) 20 MG DR capsule Take 20 mg by mouth once daily as needed (Patient not taking: Reported on 12/02/2022)   Allergies:  Codeine Nausea and Vomiting  Diflucan [Fluconazole] Other ("caused brown spots to break out on her arms")   Review of Systems:  A comprehensive 14 point ROS was performed, reviewed by me today, and the pertinent orthopaedic findings are documented in the HPI.  Physical Exam: BP 130/64  Ht 160 cm ('5\' 3"'$ )  Wt 85.5 kg (188 lb 9.6 oz)  BMI 33.41 kg/m  General/Constitutional: The patient appears to be well-nourished, well-developed, and in no acute distress. Neuro/Psych: Normal mood and affect, oriented to person, place and time. Eyes: Non-icteric. Pupils are equal, round, and reactive to light, and exhibit synchronous movement. ENT: Unremarkable. Lymphatic: No palpable adenopathy. Respiratory: Lungs clear to auscultation, Normal chest excursion, No wheezes, and Non-labored breathing Cardiovascular: Regular rate and rhythm. No murmurs. and No edema, swelling or tenderness, except as noted in detailed exam. Integumentary: No impressive skin lesions present, except as noted in detailed exam. Musculoskeletal: Unremarkable, except as noted in detailed exam.  Left knee exam: GAIT: moderate limp and uses no assistive devices. ALIGNMENT: normal SKIN: unremarkable SWELLING: minimal EFFUSION: trace WARMTH: no warmth TENDERNESS: mild over the medial joint line, but no lateral joint line or peripatellar tenderness ROM: 0 to 100 degrees with pain in maximal flexion McMURRAY'S: positive PATELLOFEMORAL: normal tracking with no peri-patellar tenderness and negative apprehension sign CREPITUS: no LACHMAN'S: negative PIVOT SHIFT: negative ANTERIOR DRAWER: negative POSTERIOR DRAWER: negative VARUS/VALGUS: stable  She is neurovascularly intact to the left lower extremity and  foot.  Knee Imaging: Recent weightbearing x-rays of the left knee are available for review and have been reviewed by myself. These films demonstrate mild degenerative changes, primarily involving the patellofemoral compartment with minimal joint space narrowing. Overall alignment is neutral. No fractures, lytic lesions, or abnormal calcifications are noted.  Knee Imaging, external: Left knee: A recent MRI scan of the left knee also is available for review and has been reviewed by myself. By report, the study demonstrates evidence of a primarily horizontal tear involving the posterior portion of the medial meniscus with extension to the inferior meniscal surface. The lateral meniscus is in excellent condition as are the anterior posterior cruciate ligaments. There is evidence of moderate to severe focal chondromalacia involving the lateral portion of the patella with subchondral bone marrow edema noted in the lateral portion of the patella. The medial and lateral compartments are well-maintained as are the tibial and patella bony structures.   Impression: 1. Primary osteoarthritis of left knee. 2. Complex tear of medial meniscus of left knee.  Plan:  1. Treatment options were discussed today with the patient. 2. The patient is scheduled for a left knee arthroscopy with debridement and possible repair versus partial medial meniscectomy. Surgery scheduled with Dr. Roland Rack on 12/10/2022. 3. The patient was instructed on the risk and benefits of surgery and wishes to proceed at this time. 4. This document will serve as a surgical history and physical for the patient. 5. The patient will follow-up per standard postop protocol. They can call the clinic they have any questions, new symptoms develop or symptoms worsen.  The procedure  was discussed with the patient, as were the potential risks (including bleeding, infection, nerve and/or blood vessel injury, persistent or recurrent pain, failure of the repair,  progression of arthritis, need for further surgery, blood clots, strokes, heart attacks and/or arhythmias, pneumonia, etc.) and benefits. The patient states her understanding and wishes to proceed.    H&P reviewed and patient re-examined. No changes.

## 2022-12-10 NOTE — Anesthesia Preprocedure Evaluation (Signed)
Anesthesia Evaluation  Patient identified by MRN, date of birth, ID band Patient awake    Reviewed: Allergy & Precautions, H&P , NPO status , reviewed documented beta blocker date and time   History of Anesthesia Complications (+) PONV and history of anesthetic complications  Airway Mallampati: II  TM Distance: >3 FB Neck ROM: full    Dental  (+) Teeth Intact, Dental Advidsory Given   Pulmonary neg pulmonary ROS   Pulmonary exam normal        Cardiovascular Exercise Tolerance: Good negative cardio ROS Normal cardiovascular exam  2019 Negative stress test, Nml EF 62%   Neuro/Psych negative neurological ROS     GI/Hepatic Neg liver ROS, PUD,GERD  Medicated and Controlled,,  Endo/Other  diabetes (borderline)Hypothyroidism    Renal/GU Renal disease     Musculoskeletal  (+) Arthritis ,    Abdominal   Peds  Hematology   Anesthesia Other Findings Past Medical History: No date: Arthritis No date: Atrophic vaginitis No date: Erosive gastritis No date: Frequency No date: Fundic gland polyps of stomach, benign No date: GERD (gastroesophageal reflux disease) No date: History of bladder infections No date: History of recurrent UTIs No date: Meniere disease No date: Nystagmus No date: OAB (overactive bladder) No date: PONV (postoperative nausea and vomiting) No date: Pure hypercholesterolemia No date: Renal stones No date: Stress incontinence No date: Thyroid disease No date: Vertigo No date: Vitamin D deficiency  Past Surgical History: 2002: ABDOMINAL HYSTERECTOMY 2009: BREAST BIOPSY; Right     Comment:  neg 1970: BREAST EXCISIONAL BIOPSY; Right     Comment:  neg 11/16/2015: ESOPHAGOGASTRODUODENOSCOPY; N/A     Comment:  Procedure: ESOPHAGOGASTRODUODENOSCOPY (EGD);  Surgeon:               Lollie Sails, MD;  [facility] END[facility]COPY;                Service: Endoscopy;  Laterality: N/A; No date: HIP  ARTHROPLASTY 06/2013: JOINT REPLACEMENT     Comment:  hip No date: maxillary sinusotomy intranasal No date: TONSILLECTOMY No date: TUBAL LIGATION  BMI    Body Mass Index: 30.00 kg/m      Reproductive/Obstetrics                             Anesthesia Physical Anesthesia Plan  ASA: 2  Anesthesia Plan: General   Post-op Pain Management:    Induction: Intravenous  PONV Risk Score and Plan: Treatment may vary due to age or medical condition, TIVA and Propofol infusion  Airway Management Planned: LMA  Additional Equipment:   Intra-op Plan:   Post-operative Plan: Extubation in OR  Informed Consent: I have reviewed the patients History and Physical, chart, labs and discussed the procedure including the risks, benefits and alternatives for the proposed anesthesia with the patient or authorized representative who has indicated his/her understanding and acceptance.     Dental Advisory Given  Plan Discussed with: CRNA  Anesthesia Plan Comments:         Anesthesia Quick Evaluation

## 2022-12-11 ENCOUNTER — Encounter: Payer: Self-pay | Admitting: Surgery

## 2022-12-11 MED ORDER — GEMTESA 75 MG PO TABS: 1.0000 | ORAL_TABLET | Freq: Every day | ORAL | 0 refills | Status: DC

## 2022-12-11 NOTE — Telephone Encounter (Signed)
Spoke with patient and she would like rx sent to pharmacy due to her having recent knee surgery. Pt would only like 30 day supply. rx sent to pharmacy by e-script

## 2022-12-15 NOTE — Anesthesia Postprocedure Evaluation (Signed)
Anesthesia Post Note  Patient: [name]  Procedure(s) Performed: ARTHROSCOPY KNEE WITH DEBRIDEMENT AND REPAIR VERSUS PARTIAL MEDIAL MENISCECTOMY. (Left: Knee)  Patient location during evaluation: PACU Anesthesia Type: General Level of consciousness: awake and alert Pain management: pain level controlled Vital Signs Assessment: post-procedure vital signs reviewed and stable Respiratory status: spontaneous breathing, nonlabored ventilation, respiratory function stable and patient connected to nasal cannula oxygen Cardiovascular status: blood pressure returned to baseline and stable Postop Assessment: no apparent nausea or vomiting Anesthetic complications: no   No notable events documented.   Last Vitals:  Vitals:   12/10/22 1045 12/10/22 1057  BP: 112/60 (!) 123/97  Pulse: (!) 126 60  Resp: 13 16  Temp:  37 C  SpO2: 93% 98%    Last Pain:  Vitals:   12/10/22 1057  TempSrc: Temporal  PainSc: 2                  Martha Clan

## 2023-02-03 ENCOUNTER — Other Ambulatory Visit: Payer: Self-pay | Admitting: Family Medicine

## 2023-02-03 DIAGNOSIS — Z1231 Encounter for screening mammogram for malignant neoplasm of breast: Secondary | ICD-10-CM

## 2023-02-20 ENCOUNTER — Ambulatory Visit
Admission: RE | Admit: 2023-02-20 | Discharge: 2023-02-20 | Disposition: A | Discharge status: Home / Self Care | Payer: Medicare Other | Source: Ambulatory Visit | Attending: Family Medicine | Admitting: Family Medicine

## 2023-02-20 DIAGNOSIS — Z1231 Encounter for screening mammogram for malignant neoplasm of breast: Secondary | ICD-10-CM | POA: Diagnosis present

## 2023-05-25 NOTE — Progress Notes (Unsigned)
05/26/2023 11:45 AM   Rise Paganini February 19, 1951 [id number]  Referring provider: Marisue Ivan, MD [phone] [facility] [address] [facility] [place],  [place] [address]  Urological history: 1. High risk hematuria -non-smoker -CTU (2018) - no worrisome findings -cysto (2018) - NED -no reports of gross heme -UA (10/2022) negative for micro heme  2. rUTI's -contributing factors of age, vaginal atrophy and DM -documented urine cultures over the last year  09/23/2022 - MUF -vaginal estrogen cream   3. OAB -contributing factors of age, vaginal atrophy and DM  No chief complaint on file.   HPI: Susan Harding is a 72 y.o. female who presents today for follow up after a trial of oxybutynin.    At her visit in November 25, 2022, she is having 8 or more daytime voids, 1-2 nocturia with a mild to strong urge to urinate.  She is having incontinence with both urge and stress.  She leaks 1-2 times weekly.  She wears panty liners daily and she does engage in toilet mapping. Patient denies any modifying or aggravating factors.  Patient denies any gross hematuria, dysuria or suprapubic/flank pain.  Patient denies any fevers, chills, nausea or vomiting.   She has been applying the vaginal estrogen cream and taking oxybutynin IR 5 mg once daily.  She states she feels her symptoms are much improved.  She is experiencing some dry mouth, but she tolerates it and it is only bothersome in the morning. PVR 12 mL.  We did discuss switching the oxybutynin to Myrbetriq or Gemtesa, but she decided to stay on the oxybutynin.  She did call back later requesting a different medication be sent to the pharmacy because of her dry mouth.   A 30-day trial of Gemtesa 75 mg daily was sent to her pharmacy.  PVR ***   PMH: Past Medical History:  Diagnosis Date   Arthritis    Atrophic vaginitis    Erosive gastritis    Frequency    Fundic gland polyps of stomach, benign    GERD (gastroesophageal  reflux disease)    History of bladder infections    History of recurrent UTIs    Meniere disease    Nystagmus    OAB (overactive bladder)    PONV (postoperative nausea and vomiting)    Pre-diabetes    Pure hypercholesterolemia    Stress incontinence    Thyroid disease    Vertigo    Vitamin D deficiency     Surgical History: Past Surgical History:  Procedure Laterality Date   ABDOMINAL HYSTERECTOMY  2002   BREAST BIOPSY Right 2009   neg   BREAST EXCISIONAL BIOPSY Right 1970   neg   ESOPHAGOGASTRODUODENOSCOPY N/A 11/16/2015   Procedure: ESOPHAGOGASTRODUODENOSCOPY (EGD);  Surgeon: Christena Deem, MD;  Location: [facility] ENDOSCOPY;  Service: Endoscopy;  Laterality: N/A;   ESOPHAGOGASTRODUODENOSCOPY (EGD) WITH PROPOFOL N/A 08/18/2019   Procedure: ESOPHAGOGASTRODUODENOSCOPY (EGD) WITH PROPOFOL;  Surgeon: Christena Deem, MD;  Location: [facility] ENDOSCOPY;  Service: Endoscopy;  Laterality: N/A;   HIP ARTHROPLASTY  2018   JOINT REPLACEMENT  06/2013   hip   KNEE ARTHROSCOPY Left 12/10/2022   Procedure: ARTHROSCOPY KNEE WITH DEBRIDEMENT AND REPAIR VERSUS PARTIAL MEDIAL MENISCECTOMY.;  Surgeon: Christena Flake, MD;  Location: [facility] ORS;  Service: Orthopedics;  Laterality: Left;   maxillary sinusotomy intranasal     TONSILLECTOMY     TUBAL LIGATION      Home Medications:  Allergies as of 05/26/2023  Reactions   Codeine Nausea Only   Fluconazole Other (See Comments)   "caused brown spots to break out on her arms"        Medication List        Accurate as of May 25, 2023 11:45 AM. If you have any questions, ask your nurse or doctor.          atorvastatin 20 MG tablet Commonly known as: LIPITOR Take 20 mg by mouth at bedtime.   Calcium Carbonate-Vitamin D 600-400 MG-UNIT tablet Take 2 tablets by mouth daily.   estradiol 0.1 MG/GM vaginal cream Commonly known as: ESTRACE VAGINAL Apply 0.5mg  (pea-sized amount)  just inside the vaginal introitus with a finger-tip every  night for two weeks and then Monday, Wednesday and Friday nights.   Gemtesa 75 MG Tabs Generic drug: Vibegron Take 1 tablet (75 mg total) by mouth daily.   HYDROcodone-acetaminophen 5-325 MG tablet Commonly known as: NORCO/VICODIN Take 1-2 tablets by mouth every 6 (six) hours as needed for moderate pain or severe pain.   levothyroxine 112 MCG tablet Commonly known as: SYNTHROID Take 112 mcg by mouth daily before breakfast.   Multi-Vitamins Tabs Take 1 tablet by mouth daily.   OMEGA 3-6-9 FATTY ACIDS PO Take 1 capsule by mouth daily.   PRESERVISION AREDS 2 PO Take 1 tablet by mouth daily.        Allergies:  Allergies  Allergen Reactions   Codeine Nausea Only   Fluconazole Other (See Comments)    "caused brown spots to break out on her arms"    Family History: Family History  Problem Relation Age of Onset   Interstitial cystitis Daughter    Kidney disease Father    Hypertension Father    Hyperlipidemia Father    Brain cancer Mother    Gout Sister    Osteoarthritis Sister    Prostate cancer Neg Hx    Kidney cancer Neg Hx    Bladder Cancer Neg Hx    Breast cancer Neg Hx     Social History:  reports that she has never smoked. She has never used smokeless tobacco. She reports that she does not drink alcohol and does not use drugs.  ROS: Pertinent ROS in HPI  Physical Exam: There were no vitals taken for this visit.  Constitutional:  Well nourished. Alert and oriented, No acute distress. HEENT: [place] AT, moist mucus membranes.  Trachea midline, no masses. Cardiovascular: No clubbing, cyanosis, or edema. Respiratory: Normal respiratory effort, no increased work of breathing. GU: No CVA tenderness.  No bladder fullness or masses. Vulvovaginal atrophy w/ pallor, loss of rugae, introital retraction, excoriations.  Vulvar thinning, fusion of labia, clitoral hood retraction, prominent urethral meatus.   *** external genitalia, *** pubic hair distribution, no lesions.   Normal urethral meatus, no lesions, no prolapse, no discharge.   No urethral masses, tenderness and/or tenderness. No bladder fullness, tenderness or masses. *** vagina mucosa, *** estrogen effect, no discharge, no lesions, *** pelvic support, *** cystocele and *** rectocele noted.  No cervical motion tenderness.  Uterus is freely mobile and non-fixed.  No adnexal/parametria masses or tenderness noted.  Anus and perineum are without rashes or lesions.   ***  Neurologic: Grossly intact, no focal deficits, moving all 4 extremities. Psychiatric: Normal mood and affect.    Laboratory Data: N/A   Pertinent Imaging: ***  Assessment & Plan:    1. OAB -Discussed the availability of other medications, Myrbetriq or Gemtesa, but since they are likely cost  prohibitive she is fine with staying with the oxybutynin IR -Continue oxybutynin IR 5 mg daily  2.  Vaginal atrophy -Discussed the pathophysiology of vaginal atrophy -Continue vaginal estrogen cream 3 nights weekly    No follow-ups on file.  These notes generated with voice recognition software. I apologize for typographical errors.  Cloretta Ned  [facility] [address]  [address] [place], [place] [address] [phone]

## 2023-05-26 ENCOUNTER — Encounter: Payer: Self-pay | Admitting: Urology

## 2023-05-26 ENCOUNTER — Ambulatory Visit (INDEPENDENT_AMBULATORY_CARE_PROVIDER_SITE_OTHER): Payer: Medicare Other | Admitting: Urology

## 2023-05-26 VITALS — BP 122/74 | HR 76 | Wt 175.0 lb

## 2023-05-26 DIAGNOSIS — N952 Postmenopausal atrophic vaginitis: Secondary | ICD-10-CM

## 2023-05-26 DIAGNOSIS — N3281 Overactive bladder: Secondary | ICD-10-CM | POA: Diagnosis not present

## 2023-05-26 LAB — BLADDER SCAN AMB NON-IMAGING

## 2023-09-06 ENCOUNTER — Other Ambulatory Visit: Payer: Self-pay

## 2023-09-06 ENCOUNTER — Emergency Department
Admission: EM | Admit: 2023-09-06 | Discharge: 2023-09-06 | Disposition: A | Discharge status: Home / Self Care | Payer: Medicare Other | Attending: Emergency Medicine | Admitting: Emergency Medicine

## 2023-09-06 DIAGNOSIS — H9201 Otalgia, right ear: Secondary | ICD-10-CM | POA: Diagnosis present

## 2023-09-06 DIAGNOSIS — H6501 Acute serous otitis media, right ear: Secondary | ICD-10-CM | POA: Insufficient documentation

## 2023-09-06 HISTORY — DX: Meniere's disease, unspecified ear: H81.09

## 2023-09-06 MED ORDER — PREDNISONE 10 MG PO TABS: ORAL_TABLET | ORAL | 0 refills | Status: DC

## 2023-09-06 MED ORDER — PREDNISONE 20 MG PO TABS
60.0000 mg | ORAL_TABLET | Freq: Once | ORAL | Status: AC
  Administered 2023-09-06: 60 mg via ORAL
  Filled 2023-09-06: qty 3

## 2023-09-07 ENCOUNTER — Encounter: Payer: Self-pay | Admitting: Urology

## 2023-12-31 NOTE — Progress Notes (Signed)
 01/01/2024 9:05 PM   Susan Harding [birth year] [id number]  Referring provider: Marisue Ivan, MD [phone] [facility] [address] [facility] [place],  [place] [address]  Urological history: 1. 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 rUTI's -contributing factors of age, vaginal atrophy and DM -documented urine cultures over the last year  12/13/2023, E. Coli  -vaginal estrogen cream   3. OAB -contributing factors of age, vaginal atrophy and DM -Oxybutynin IR 5 mg daily  Chief Complaint  Patient presents with   Follow-up   HPI: Susan Harding is a 73 y.o. female who presents today for possible UTI.   Previous records reviewed.     1 month ago, she had dysuria, frequency and urgency.  She went to a walk-in clinic and was found to have an infected looking urinalysis and she was put on Omnicef 300 mg twice daily for 7 days.  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  Her symptoms have lessened, but not have completely abated.  She is having 8 or more daytime voids, 1-2 episodes of nocturia and a mild urge to urinate.  She has stress urinary continence.  She leaks 3 or more times a week.  She does not wear absorbent products.  She does not limit fluid intake.  She does engage in toilet mapping.  Patient denies any modifying or aggravating factors.  Patient denies any gross hematuria or suprapubic/flank pain.  Patient denies any fevers, chills, nausea or vomiting.    UA yellow clear, specific gravity 1.015, trace heme, pH 7.0, 0-5 WBCs, 3-10 RBCs, 0-10 epithelial cells and a few bacteria.    PVR 86 mL    PMH: Past Medical History:  Diagnosis Date   Arthritis    Atrophic vaginitis    Erosive gastritis    Frequency    Fundic gland polyps of stomach, benign    GERD (gastroesophageal reflux disease)    History of bladder infections    History of recurrent  UTIs    Meniere disease    Meniere's disease    Nystagmus    OAB (overactive bladder)    PONV (postoperative nausea and vomiting)    Pre-diabetes    Pure hypercholesterolemia    Stress incontinence    Thyroid disease    Vertigo    Vitamin D deficiency     Surgical History: Past Surgical History:  Procedure Laterality Date   ABDOMINAL HYSTERECTOMY  2002   BREAST BIOPSY Right 2009   neg   BREAST EXCISIONAL BIOPSY Right 1970   neg   ESOPHAGOGASTRODUODENOSCOPY N/A 11/16/2015   Procedure: ESOPHAGOGASTRODUODENOSCOPY (EGD);  Surgeon: Christena Deem, MD;  Location: [facility] ENDOSCOPY;  Service: Endoscopy;  Laterality: N/A;   ESOPHAGOGASTRODUODENOSCOPY (EGD) WITH PROPOFOL N/A 08/18/2019   Procedure: ESOPHAGOGASTRODUODENOSCOPY (EGD) WITH PROPOFOL;  Surgeon: Christena Deem, MD;  Location: [facility] ENDOSCOPY;  Service: Endoscopy;  Laterality: N/A;   HIP ARTHROPLASTY  2018   JOINT REPLACEMENT  06/2013   hip   KNEE ARTHROSCOPY Left 12/10/2022   Procedure: ARTHROSCOPY KNEE WITH DEBRIDEMENT AND REPAIR VERSUS PARTIAL MEDIAL MENISCECTOMY.;  Surgeon: Christena Flake, MD;  Location: [facility] ORS;  Service: Orthopedics;  Laterality: Left;   maxillary sinusotomy intranasal     TONSILLECTOMY     TUBAL LIGATION      Home Medications:  Allergies as of 01/01/2024       Reactions   Codeine Nausea  Only   Fluconazole Other (See Comments)   "caused brown spots to break out on her arms"        Medication List        Accurate as of January 01, 2024 11:59 PM. If you have any questions, ask your nurse or doctor.          atorvastatin 20 MG tablet Commonly known as: LIPITOR Take 20 mg by mouth at bedtime.   Calcium Carbonate-Vitamin D 600-400 MG-UNIT tablet Take 2 tablets by mouth daily.   celecoxib 100 MG capsule Commonly known as: CELEBREX Take 1 capsule by mouth 2 (two) times daily.   estradiol 0.1 MG/GM vaginal cream Commonly known as: ESTRACE VAGINAL Apply 0.5mg  (pea-sized amount)   just inside the vaginal introitus with a finger-tip every night for two weeks and then Monday, Wednesday and Friday nights.   gabapentin 100 MG capsule Commonly known as: NEURONTIN Take by mouth.   Gemtesa 75 MG Tabs Generic drug: Vibegron Take 1 tablet (75 mg total) by mouth daily.   levothyroxine 112 MCG tablet Commonly known as: SYNTHROID Take 112 mcg by mouth daily before breakfast.   Multi-Vitamins Tabs Take 1 tablet by mouth daily.   OMEGA 3-6-9 FATTY ACIDS PO Take 1 capsule by mouth daily.   oxybutynin 5 MG tablet Commonly known as: DITROPAN   predniSONE 10 MG tablet Commonly known as: DELTASONE 10 day taper. 6,6,5,5,4,4,3,3,2,2,1,1   PRESERVISION AREDS 2 PO Take 1 tablet by mouth daily.   triamterene-hydrochlorothiazide 37.5-25 MG tablet Commonly known as: MAXZIDE-25 Take 1 tablet by mouth daily.        Allergies:  Allergies  Allergen Reactions   Codeine Nausea Only   Fluconazole Other (See Comments)    "caused brown spots to break out on her arms"    Family History: Family History  Problem Relation Age of Onset   Interstitial cystitis Daughter    Kidney disease Father    Hypertension Father    Hyperlipidemia Father    Brain cancer Mother    Gout Sister    Osteoarthritis Sister    Prostate cancer Neg Hx    Kidney cancer Neg Hx    Bladder Cancer Neg Hx    Breast cancer Neg Hx     Social History:  reports that she has never smoked. She has never used smokeless tobacco. She reports that she does not drink alcohol and does not use drugs.  ROS: Pertinent ROS in HPI  Physical Exam: BP 130/76   Pulse 73   Constitutional:  Well nourished. Alert and oriented, No acute distress. HEENT: [place] AT, moist mucus membranes.  Trachea midline Cardiovascular: No clubbing, cyanosis, or edema. Respiratory: Normal respiratory effort, no increased work of breathing. Neurologic: Grossly intact, no focal deficits, moving all 4 extremities. Psychiatric: Normal  mood and affect.    Laboratory Data: Thyroid Stimulating-Hormone (TSH) Order: [id number] Component Ref Range & Units 1 mo ago  Thyroid Stimulating Hormone (TSH) 0.450-5.330 uIU/ml uIU/mL 0.142 Low   Comment: Reference Range for Pregnant Females >= 110 yrs old: Normal Range for 1st trimester: 0.05-3.70 ulU/ml Normal Range for 2nd trimester: 0.31-4.35 ulU/ml  Resulting Agency [facility] - LAB   Specimen Collected: 11/06/23 08:41   Performed by: [facility] Last Resulted: 11/06/23 14:02  Received From: [facility]  Result Received: 12/18/23 08:47   Lipid Panel w/calc LDL Order: [id number] Component Ref Range & Units 1 mo ago  Cholesterol, Total 100 - 200 mg/dL  153  Triglyceride 35 - 199 mg/dL 253  HDL (High Density Lipoprotein) Cholesterol 35.0 - 85.0 mg/dL 66.4  LDL Calculated 0 - 130 mg/dL 67  VLDL Cholesterol mg/dL 20  Cholesterol/HDL Ratio 2.3  Resulting Agency [facility]   Specimen Collected: 11/06/23 08:41   Performed by: [facility] Last Resulted: 11/06/23 14:02  Received From: [facility]  Result Received: 12/18/23 08:47   Comprehensive Metabolic Panel (CMP) Order: [id number] Component Ref Range & Units 1 mo ago  Glucose 70 - 110 mg/dL 91  Sodium 563 - 875 mmol/L 143  Potassium 3.6 - 5.1 mmol/L 3.5 Low   Chloride 97 - 109 mmol/L 105  Carbon Dioxide (CO2) 22.0 - 32.0 mmol/L 32.9 High   Urea Nitrogen (BUN) 7 - 25 mg/dL 18  Creatinine 0.6 - 1.1 mg/dL 0.5 Low   Glomerular Filtration Rate (eGFR) >60 mL/min/1.73sq m 98  Comment: CKD-EPI (2021) does not include patient's race in the calculation of eGFR.  Monitoring changes of plasma creatinine and eGFR over time is useful for monitoring kidney function.  Interpretive Ranges for eGFR (CKD-EPI 2021):  eGFR:       >60 mL/min/1.73 sq. m - Normal eGFR:       30-59 mL/min/1.73 sq. m - Moderately Decreased eGFR:       15-29  mL/min/1.73 sq. m  - Severely Decreased eGFR:       < 15 mL/min/1.73 sq. m  - Kidney Failure   Note: These eGFR calculations do not apply in acute situations when eGFR is changing rapidly or patients on dialysis.  Calcium 8.7 - 10.3 mg/dL 9.9  AST 8 - 39 U/L 17  ALT 5 - 38 U/L 15  Alk Phos (alkaline Phosphatase) 34 - 104 U/L 87  Albumin 3.5 - 4.8 g/dL 4.3  Bilirubin, Total 0.3 - 1.2 mg/dL 0.5  Protein, Total 6.1 - 7.9 g/dL 6.8  A/G Ratio 1.0 - 5.0 gm/dL 1.7  Resulting Agency [facility]   Specimen Collected: 11/06/23 08:41   Performed by: [facility] Last Resulted: 11/06/23 14:02  Received From: [facility]  Result Received: 12/18/23 08:47   Urinalysis See EPIC and HPI I have reviewed the labs.  See HPI.      Pertinent Imaging:  01/01/24 10:31  Scan Result 86 ml   Assessment & Plan:    1. OAB -Continue oxybutynin IR 5 mg daily  2.  Vaginal atrophy -not bothersome  3.  Microscopic hematuria -UA w/ micro heme -urine cultures pending -Explained that we will send urine to rule out either and it adequately treated UTI or a UTI caused by a completely different bacteria -If urine culture is positive, we will treat with appropriate antibiotics and retest the urine to see if the microscopic hematuria abates -If urine culture is negative, we discussed pursuing a hematuria workup as it has been since 2018 since her last  3. Urgency -May be due to an adequate treated UTI versus a another bacteria causing UTI versus possibly a ureteral stone -Urine cultures are pending -If symptoms persist, we will pursue other imaging to rule out nephrolithiasis    Return for pending urine culture results .  These notes generated with voice recognition software. I apologize for typographical errors.  Cloretta Ned  [facility] [address]  [address] [place], [place] [address] [phone]

## 2024-01-01 ENCOUNTER — Ambulatory Visit: Payer: Medicare Other | Admitting: Urology

## 2024-01-01 VITALS — BP 130/76 | HR 73

## 2024-01-01 DIAGNOSIS — R3129 Other microscopic hematuria: Secondary | ICD-10-CM | POA: Diagnosis not present

## 2024-01-01 DIAGNOSIS — N3281 Overactive bladder: Secondary | ICD-10-CM

## 2024-01-01 DIAGNOSIS — N952 Postmenopausal atrophic vaginitis: Secondary | ICD-10-CM

## 2024-01-01 LAB — MICROSCOPIC EXAMINATION

## 2024-01-01 LAB — URINALYSIS, COMPLETE
Bilirubin, UA: NEGATIVE
Glucose, UA: NEGATIVE
Ketones, UA: NEGATIVE
Leukocytes,UA: NEGATIVE
Nitrite, UA: NEGATIVE
Protein,UA: NEGATIVE
Specific Gravity, UA: 1.015 (ref 1.005–1.030)
Urobilinogen, Ur: 0.2 mg/dL (ref 0.2–1.0)
pH, UA: 7 (ref 5.0–7.5)

## 2024-01-01 LAB — BLADDER SCAN AMB NON-IMAGING: Scan Result: 86

## 2024-01-01 NOTE — Patient Instructions (Signed)
 I have sent your urine for culture we should have that result available Monday or Tuesday and we will contact you with those results.

## 2024-01-03 ENCOUNTER — Encounter: Payer: Self-pay | Admitting: Urology

## 2024-01-04 ENCOUNTER — Other Ambulatory Visit: Payer: Self-pay | Admitting: Urology

## 2024-01-04 DIAGNOSIS — R3129 Other microscopic hematuria: Secondary | ICD-10-CM

## 2024-01-04 DIAGNOSIS — N3281 Overactive bladder: Secondary | ICD-10-CM

## 2024-01-04 LAB — CULTURE, URINE COMPREHENSIVE

## 2024-01-20 IMAGING — MG MM DIGITAL SCREENING BILAT W/ TOMO AND CAD
8 series · 8 of 24 positions shown · non-contrast
Comparison: Previous exam(s).

CLINICAL DATA: Screening.

EXAM:
DIGITAL SCREENING BILATERAL MAMMOGRAM WITH TOMOSYNTHESIS AND CAD
TECHNIQUE: Bilateral screening digital craniocaudal and mediolateral oblique
mammograms were obtained. Bilateral screening digital breast
tomosynthesis was performed. The images were evaluated with
computer-aided detection.

[R MLO synth-2D]
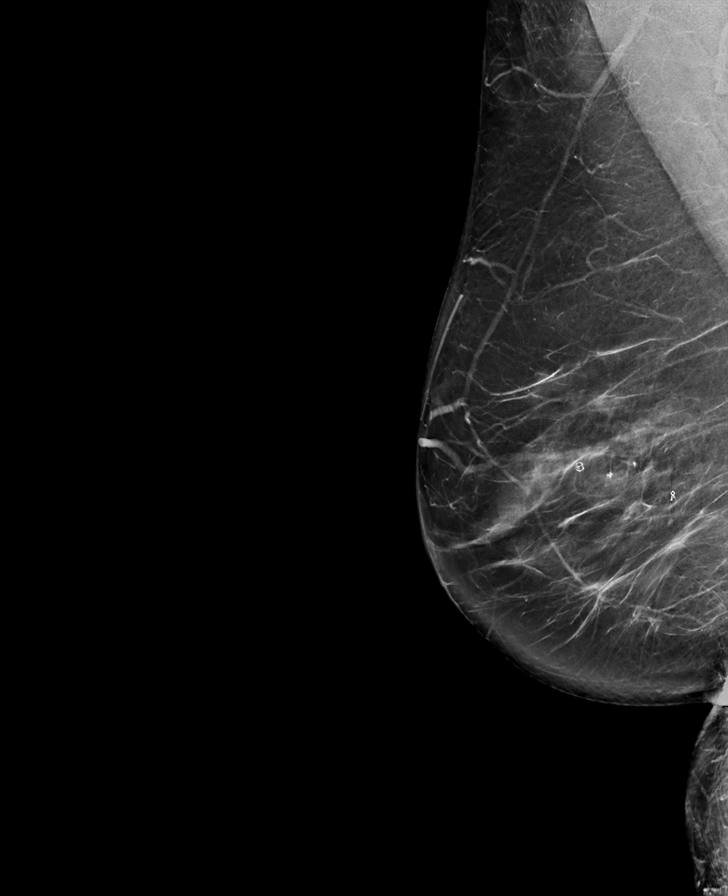

[L MLO synth-2D]
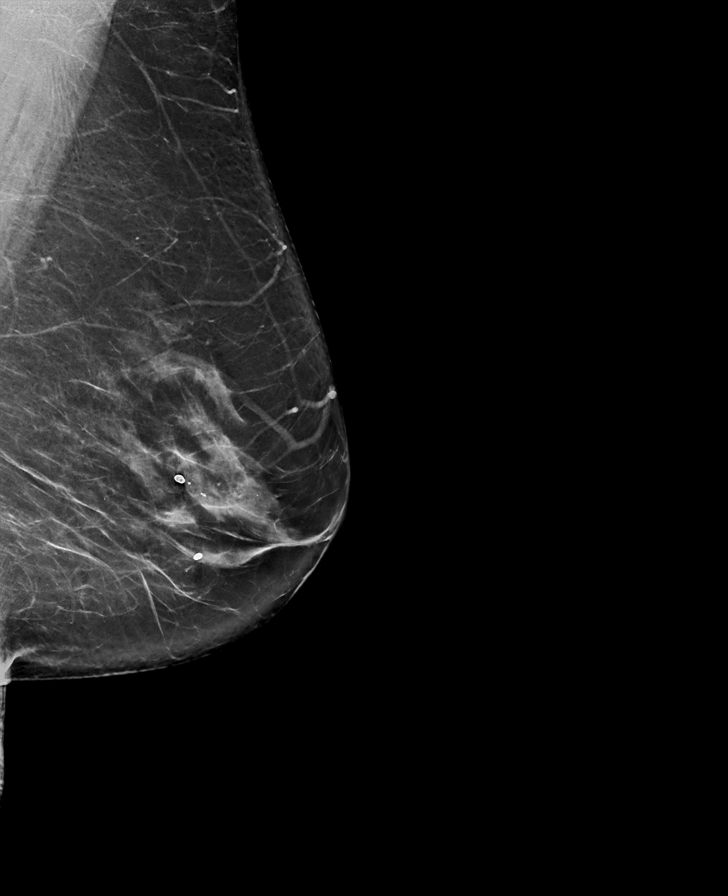

[L CC synth-2D]
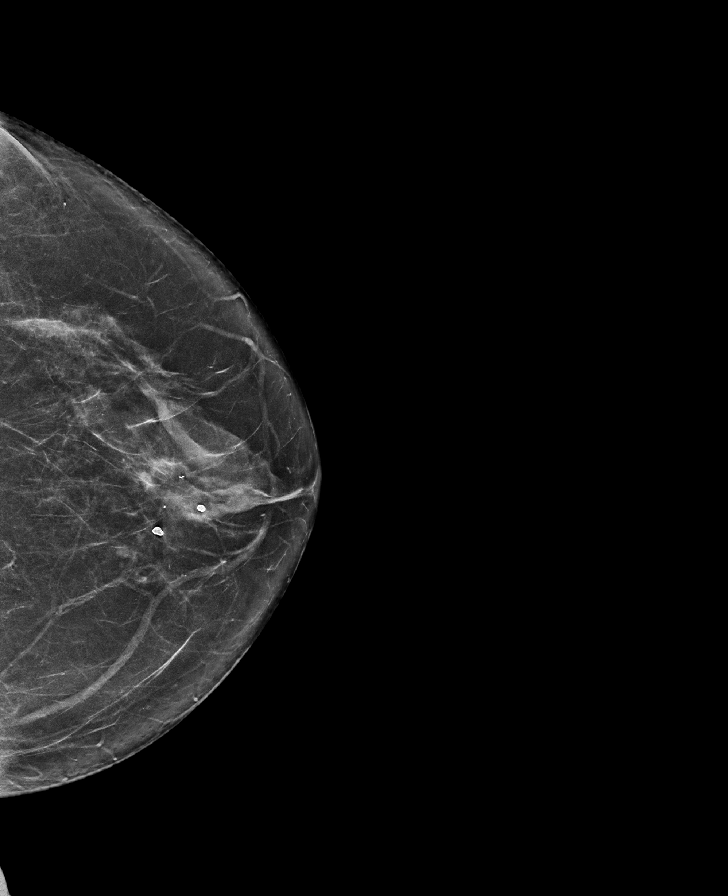

[R CC synth-2D]
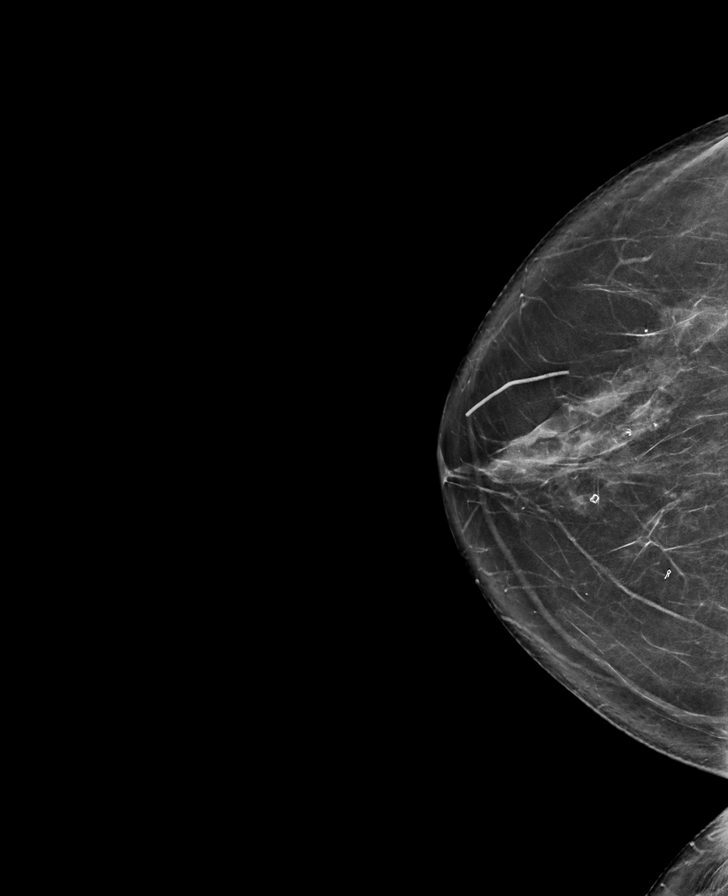

[R CC tomo · tomo slice 39/76.0]
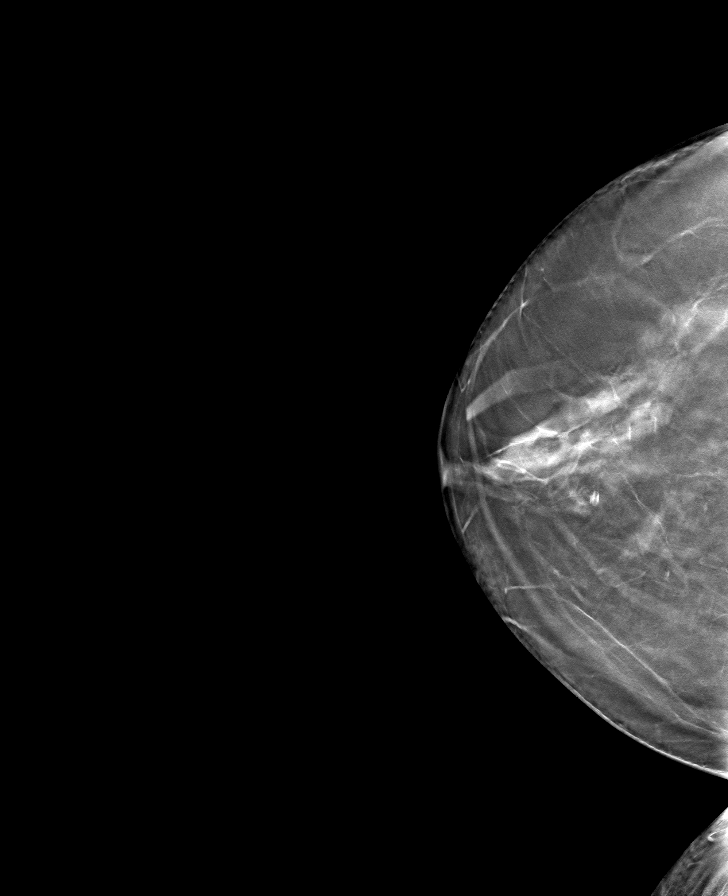

[L MLO tomo · tomo slice 43/86.0]
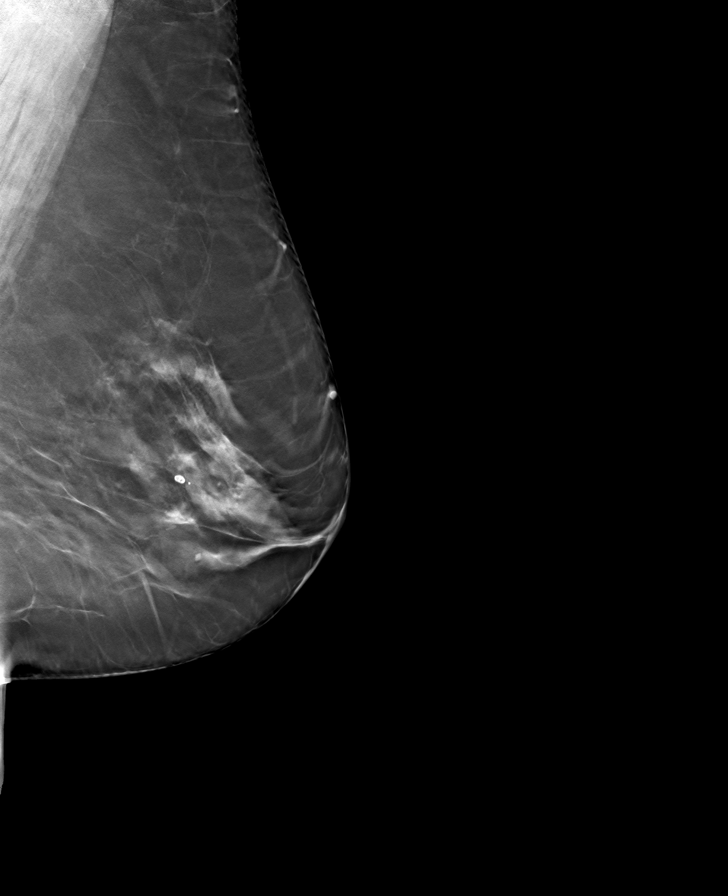

[L CC tomo · tomo slice 39/77.0]
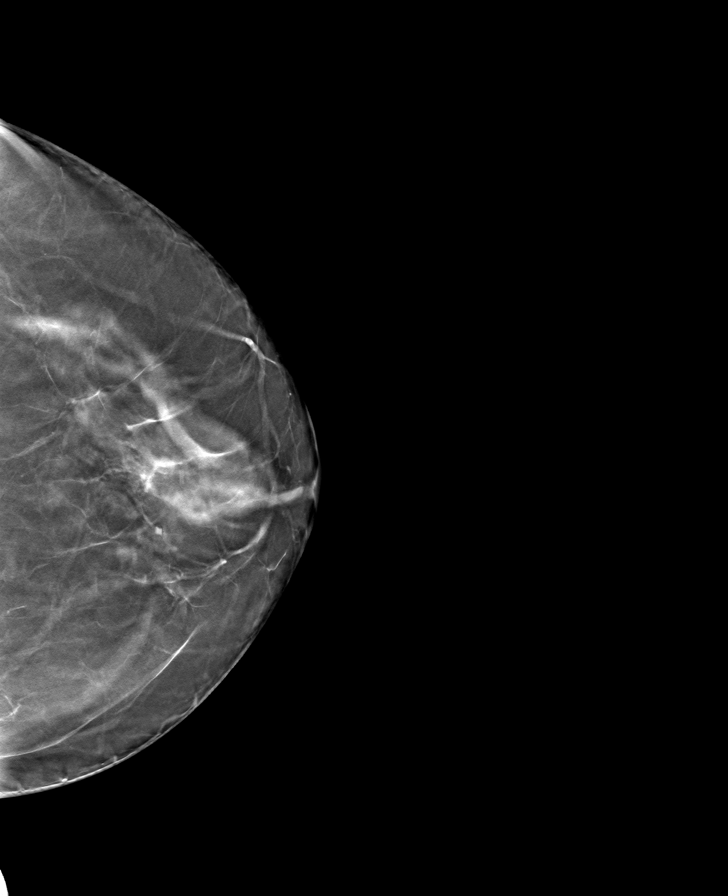

[R MLO tomo · tomo slice 43/86.0]
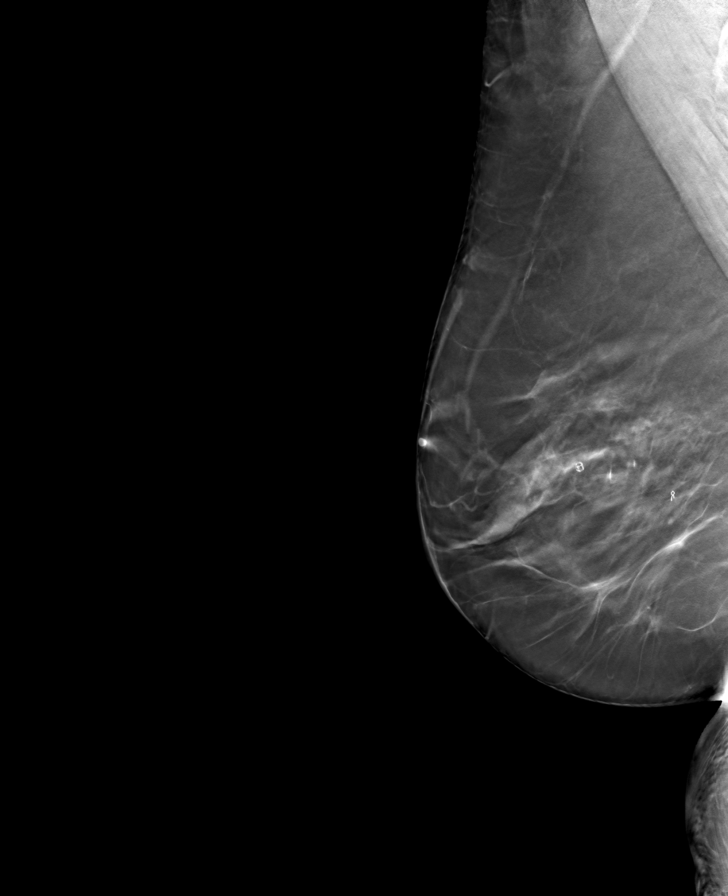

[8 of 24 positions shown; findings below may reference images not displayed]

ACR Breast Density Category c: The breast tissue is heterogeneously
dense, which may obscure small masses.
FINDINGS: There are no findings suspicious for malignancy.
IMPRESSION: No mammographic evidence of malignancy. A result letter of this
screening mammogram will be mailed directly to the patient.

RECOMMENDATION:
Screening mammogram in one year. (Code:Q3-W-BC3)

BI-RADS CATEGORY  1: Negative.

## 2024-05-12 ENCOUNTER — Other Ambulatory Visit: Payer: Self-pay | Admitting: Podiatry

## 2024-05-12 DIAGNOSIS — S92244D Nondisplaced fracture of medial cuneiform of right foot, subsequent encounter for fracture with routine healing: Secondary | ICD-10-CM

## 2024-05-12 DIAGNOSIS — M19072 Primary osteoarthritis, left ankle and foot: Secondary | ICD-10-CM

## 2024-05-12 DIAGNOSIS — E1142 Type 2 diabetes mellitus with diabetic polyneuropathy: Secondary | ICD-10-CM

## 2024-05-12 DIAGNOSIS — E119 Type 2 diabetes mellitus without complications: Secondary | ICD-10-CM

## 2024-05-13 ENCOUNTER — Ambulatory Visit
Admission: RE | Admit: 2024-05-13 | Discharge: 2024-05-13 | Disposition: A | Discharge status: Home / Self Care | Source: Ambulatory Visit | Attending: Podiatry

## 2024-05-13 DIAGNOSIS — E1142 Type 2 diabetes mellitus with diabetic polyneuropathy: Secondary | ICD-10-CM

## 2024-05-13 DIAGNOSIS — S92244D Nondisplaced fracture of medial cuneiform of right foot, subsequent encounter for fracture with routine healing: Secondary | ICD-10-CM

## 2024-05-13 DIAGNOSIS — E119 Type 2 diabetes mellitus without complications: Secondary | ICD-10-CM

## 2024-05-13 DIAGNOSIS — M19071 Primary osteoarthritis, right ankle and foot: Secondary | ICD-10-CM

## 2024-05-19 NOTE — Progress Notes (Signed)
 Chief Complaint  Patient presents with  . Follow-up    HPI  Susan  Harding is a 73 y.o. here for follow up of chronic medical issues  Hypothyroidism: No acute issues.  Tolerating med w/o adverse effects.  Denies any palpitations, wt changes, or temperature fluctuations.   Obesity: Aware of weight issue.  Patient frustrated with inability to lose weight because she is exercising trying to eat healthy.  Borderline DM: No acute issues.  No meds.  Asymptomatic.  Not checking CBGs.  Patient is exercising and eating healthy.  Avoiding sugars.  HLD: No acute issues.  Tolerating medications without adverse effects.  Denies any myalgia.  No cp or CVA symptoms.   ROS Review of systems is unremarkable for any active cardiac, respiratory, GI, GU, hematologic, neurologic, dermatologic, HEENT, or psychiatric symptoms except as noted above.  No fevers, chills, or constitutional symptoms.   Current Outpatient Medications  Medication Sig Dispense Refill  . acetaminophen  (TYLENOL ) 500 MG tablet Take 1,000 mg by mouth every 8 (eight) hours as needed    . atorvastatin (LIPITOR) 80 MG tablet Take 20 mg by mouth nightly. Takes 1/4 tablet nightly     . calcium carbonate-vitamin D3 (CALTRATE 600+D) 600 mg-10 mcg (400 unit) tablet Take 1 tablet by mouth once daily    . celecoxib (CELEBREX) 100 MG capsule Take 1 capsule (100 mg total) by mouth 2 (two) times daily (Patient taking differently: Take 100 mg by mouth 2 (two) times daily as needed for Pain) 60 capsule 1  . cyanocobalamin (VITAMIN B12) 1000 MCG tablet Take 1,000 mcg by mouth once daily    . diclofenac (VOLTAREN) 1 % topical gel Apply 2 g topically 3 (three) times daily (Patient taking differently: Apply 2 g topically 3 (three) times daily as needed) 100 g 2  . estradioL  (ESTRACE ) 0.01 % (0.1 mg/gram) vaginal cream Apply 0.5mg  (pea-sized amount)  just inside the vaginal introitus with a finger-tip every night for two weeks and then Monday, Wednesday  and Friday nights.    . levothyroxine (SYNTHROID) 88 MCG tablet TAKE 1 TABLET EVERY DAY ON EMPTY STOMACHWITH A GLASS OF WATER AT LEAST 30-60 MINBEFORE BREAKFAST 100 tablet 1  . multivitamin tablet Take 1 tablet by mouth once daily.    SABRA omega-3 acid ethyl esters (LOVAZA) 1 gram capsule Take 1 g by mouth once daily    . oxyBUTYnin  (DITROPAN ) 5 mg tablet Take 5 mg by mouth once daily    . triamterene-hydroCHLOROthiazide (MAXZIDE-25) 37.5-25 mg tablet Take 1 tablet by mouth once daily    . VIT A/VIT C/VIT E/ZINC/COPPER (PRESERVISION AREDS ORAL) Take 2 tablets by mouth once daily.      . metFORMIN (GLUCOPHAGE-XR) 500 MG XR tablet Take 1 tablet (500 mg total) by mouth daily with dinner 90 tablet 1   No current facility-administered medications for this visit.    Allergies as of 05/19/2024 - Reviewed 05/19/2024  Allergen Reaction Noted  . Codeine Nausea and Vomiting 06/16/2013  . Diflucan [fluconazole] Other (See Comments) 06/21/2018    Patient Active Problem List  Diagnosis  . Hip pain - followed byDr. Charleen ([facility])  . Acquired hypothyroidism (TSH 0.6 - 05/12/24)  . History of recurrent UTIs - followed by Clotilda Cornwall  . Borderline diabetes (A1c 6% - 05/12/24)  . Pure hypercholesterolemia (LDL 67 - 05/12/24)  . Medicare annual wellness visit, initial 10/06/16  . Osteopenia of lumbar spine (Dexa 07/06/19 - repeat 2 yrs)  . History of hematuria  . Medicare  annual wellness visit, subsequent 11/13/23  . History of right hip replacement: [facility] in 2014  . Abnormal electrocardiogram  . 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 Class 1 obesity due to excess calories with serious comorbidity and body mass index (BMI) of 32.0 to 32.9 in adult  . Primary osteoarthritis of left knee  . Complex tear of medial meniscus of left knee as current injury  . Synovial plica syndrome of left knee  . Meniere disease, right on Maxzide 25 - followed by Dr. Herminio    Past  Medical History:  Diagnosis Date  . Arthritis    Osteoarthritis  . Borderline diabetes   . BPPV (benign paroxysmal positional vertigo)   . Diabetes mellitus type 2, uncomplicated (CMS/HHS-HCC)    pre diabetic - no medications  . Erosive gastritis 11/16/2015  . Fundic gland polyps of stomach, benign 11/16/2015  . GERD (gastroesophageal reflux disease)   . Hearing loss    Chronic hearing loss- followed by Dr. Herminio  . History of hematuria 02/10/2017  . History of recurrent UTIs    Followed by Dr. Shiela  . Osteoarthritis 11/2017  . PONV (postoperative nausea and vomiting)   . Pure hypercholesterolemia (LDL 68 - 06/08/18)   . Thyroid disease   . Urinary frequency    Followed by Dr. Shiela    Past Surgical History:  Procedure Laterality Date  . TONSILLECTOMY  1957  . HYSTERECTOMY  2004   With BSO  . ARTHROPLASTY HIP TOTAL Right 06/28/2013   Procedure: ARTHROPLASTY HIP TOTAL;  Surgeon: Glendia Trenda Lawrence, MD;  Location: [facility];  Service: Orthopedics;  Laterality: Right;  . EGD  11/16/2015   Erosive gastritis/Fundic Gland Polyps/Repeat 21yr to recheck GEJ/MUS  . ARTHROPLASTY HIP TOTAL Left 06/21/2018   Procedure: LEFT TOTAL HIP REPLACEMENT;  Surgeon: Lawrence Glendia Trenda, MD;  Location: [facility] OR;  Service: Orthopedics;  Laterality: Left;  . EGD  08/18/2019   GERD/Repeat 3 months/MUS  . Extensive arthroscopic synovectomy with debridement of plica, partial medial meniscectomy, abrasion chondroplasty of femoral trochlea, and reinjection of harvested infrapatellar fat cells, left knee Left 12/10/2022   D.Poggi  . JOINT REPLACEMENT    . MAXILLARY SINUSOTOMY INTRANASAL    . TUBAL LIGATION      Vitals:   05/19/24 1040  BP: 138/76  Pulse: 70  SpO2: 97%  Weight: 80.6 kg (177 lb 12.8 oz)  Height: 157.5 cm (5' 2)  PainSc:   2  PainLoc: Knee   Body mass index is 32.52 kg/m.  Exam  General. Well appearing; NAD; VS reviewed     Eyes. Sclera and conjunctiva clear; Vision grossly  intact; extraocular movements intact Neck. Supple. No goiter or nodules Lungs. Respirations unlabored; clear to auscultation bilaterally Cardiovascular. Heart regular rate and rhythm without murmurs, gallops, or rubs Abdomen. Soft; non tender; non distended; no masses or organomegaly Extremities. no edema Skin. Normal color and turgor Neurologic. Alert and oriented x3; CN 2-12 grossly intact; no focal deficits  Assessment and Plan  1. Acquired hypothyroidism (TSH 0.6 - 05/12/24) Well controlled.  No changes to regimen.  -     Thyroid Stimulating-Hormone (TSH); Future  2. Borderline diabetes (A1c 6% - 05/12/24) Unchanged.  Discussed possibility of insulin resistance.  Patient willing to start metformin 500 mg XR daily and reassess in 3 months.  Continue to work on [employer] and exercise.  Will provide glucometer to check CBGs twice a week fasting with goal less than 130. -  Hemoglobin A1C; Future  3. Class 1 obesity due to excess calories with serious comorbidity and body mass index (BMI) of 32.0 to 32.9 in adult Not at goal.  Counseled on nutrition modification (low sugar/carb mediterranean) and exercise (strength/cardio).  Goal is to lose 1lb/wk.  Focus on low calorie diet (< net 1400/day).  Add metformin as stated above.  4. Pure hypercholesterolemia (LDL 67 - 05/12/24) Well controlled.  LFTs wnls (05/2024). Continue with current regimen.  Counseled on mediterranean diet and aerobic exercise. -     Comprehensive Metabolic Panel (CMP); Future -     Lipid Panel w/calc LDL; Future  5. Hypokalemia Acute.  Likely combination of humidity and sweating along with Maxide.  Increase foods Contrin potassium.  Monitor for now.  Asymptomatic.  6. Leukocytosis, unspecified type Acute.  Likely due to recent corticosteroid injection.  Reassess CBC in 3 months. -     CBC w/auto Differential (5 Part); Future  Other orders -     Follow up in Primary Care -     metFORMIN (GLUCOPHAGE-XR)  500 MG XR tablet; Take 1 tablet (500 mg total) by mouth daily with dinner -     Follow up in Primary Care; Future  F/u 3 months for reck after adding metformin 500mg  XR qd; labs prior  ALDA CARPEN, MD

## 2024-05-23 NOTE — Progress Notes (Unsigned)
 05/25/2024 9:32 AM   Susan  W Harding 06/10/1951 [id number]  Referring provider: Alla Amis, MD [phone] [facility] [address] [facility] [place],  [place] [address]  Urological history: 1. 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 rUTI's -contributing factors of age, vaginal atrophy and DM -vaginal estrogen cream   3. OAB -contributing factors of age, vaginal atrophy and DM -Oxybutynin  IR 5 mg daily  Chief Complaint  Patient presents with   Follow-up   HPI: Susan  LELON Harding is a 73 y.o. female who presents today for possible UTI with her husband, Lamar.   Previous records reviewed.     At her appointment on 02/282/2025, 1 month ago, she had dysuria, frequency and urgency.  She went to a walk-in clinic and was found to have an infected looking urinalysis and she was put on Omnicef 300 mg twice daily for 7 days.  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  Her symptoms have lessened, but not have completely abated.  She is having 8 or more daytime voids, 1-2 episodes of nocturia and a mild urge to urinate.  She has stress urinary continence.  She leaks 3 or more times a week.  She does not wear absorbent products.  She does not limit fluid intake.  She does engage in toilet mapping.  Patient denies any modifying or aggravating factors.  Patient denies any gross hematuria or suprapubic/flank pain.  Patient denies any fevers, chills, nausea or vomiting.  UA yellow clear, specific gravity 1.015, trace heme, pH 7.0, 0-5 WBCs, 3-10 RBCs, 0-10 epithelial cells and a few bacteria.  PVR 86 mL.  Urine culture was negative and it was recommended that she have an x-ray which was not completed.  She is having no symptoms today.  Patient denies any modifying or aggravating factors.  Patient denies any recent UTI's, gross hematuria, dysuria or suprapubic/flank pain.  Patient denies  any fevers, chills, nausea or vomiting.    UA with leuks, microscopic hematuria and bacteriuria.    PVR 0 mL   Serum creatinine (05/2024) 0.5, eGFR 98  Hemoglobin A1c (05/2024) 6.0  PMH: Past Medical History:  Diagnosis Date   Arthritis    Atrophic vaginitis    Erosive gastritis    Frequency    Fundic gland polyps of stomach, benign    GERD (gastroesophageal reflux disease)    History of bladder infections    History of recurrent UTIs    Meniere disease    Meniere's disease    Nystagmus    OAB (overactive bladder)    PONV (postoperative nausea and vomiting)    Pre-diabetes    Pure hypercholesterolemia    Stress incontinence    Thyroid disease    Vertigo    Vitamin D deficiency     Surgical History: Past Surgical History:  Procedure Laterality Date   ABDOMINAL HYSTERECTOMY  2002   BREAST BIOPSY Right 2009   neg   BREAST EXCISIONAL BIOPSY Right 1970   neg   ESOPHAGOGASTRODUODENOSCOPY N/A 11/16/2015   Procedure: ESOPHAGOGASTRODUODENOSCOPY (EGD);  Surgeon: Gladis RAYMOND Mariner, MD;  Location: [facility] ENDOSCOPY;  Service: Endoscopy;  Laterality: N/A;   ESOPHAGOGASTRODUODENOSCOPY (EGD) WITH PROPOFOL  N/A 08/18/2019   Procedure: ESOPHAGOGASTRODUODENOSCOPY (EGD) WITH PROPOFOL ;  Surgeon: Mariner Gladis RAYMOND, MD;  Location: [facility] ENDOSCOPY;  Service: Endoscopy;  Laterality: N/A;   HIP ARTHROPLASTY  2018   JOINT REPLACEMENT  06/2013   hip  KNEE ARTHROSCOPY Left 12/10/2022   Procedure: ARTHROSCOPY KNEE WITH DEBRIDEMENT AND REPAIR VERSUS PARTIAL MEDIAL MENISCECTOMY.;  Surgeon: Edie Norleen PARAS, MD;  Location: [facility] ORS;  Service: Orthopedics;  Laterality: Left;   maxillary sinusotomy intranasal     TONSILLECTOMY     TUBAL LIGATION      Home Medications:  Allergies as of 05/25/2024       Reactions   Codeine Nausea Only   Fluconazole Other (See Comments)   caused brown spots to break out on her arms        Medication List        Accurate as of May 25, 2024  9:32 AM. If you  have any questions, ask your nurse or doctor.          atorvastatin 20 MG tablet Commonly known as: LIPITOR Take 20 mg by mouth at bedtime.   Calcium Carbonate-Vitamin D 600-400 MG-UNIT tablet Take 2 tablets by mouth daily.   celecoxib 100 MG capsule Commonly known as: CELEBREX Take 1 capsule by mouth 2 (two) times daily.   estradiol  0.1 MG/GM vaginal cream Commonly known as: ESTRACE  VAGINAL Apply 0.5mg  (pea-sized amount)  just inside the vaginal introitus with a finger-tip every night for two weeks and then Monday, Wednesday and Friday nights.   gabapentin 100 MG capsule Commonly known as: NEURONTIN Take by mouth.   Gemtesa  75 MG Tabs Generic drug: Vibegron  Take 1 tablet (75 mg total) by mouth daily.   levothyroxine 112 MCG tablet Commonly known as: SYNTHROID Take 112 mcg by mouth daily before breakfast.   metFORMIN 500 MG 24 hr tablet Commonly known as: GLUCOPHAGE-XR Take 500 mg by mouth.   Multi-Vitamins Tabs Take 1 tablet by mouth daily.   OMEGA 3-6-9 FATTY ACIDS PO Take 1 capsule by mouth daily.   oxybutynin  5 MG tablet Commonly known as: DITROPAN  TAKE ONE (1) TABLET BY MOUTH TWO TIMES PER DAY   predniSONE  10 MG tablet Commonly known as: DELTASONE  10 day taper. 6,6,5,5,4,4,3,3,2,2,1,1   PRESERVISION AREDS 2 PO Take 1 tablet by mouth daily.   triamterene-hydrochlorothiazide 37.5-25 MG tablet Commonly known as: MAXZIDE-25 Take 1 tablet by mouth daily.        Allergies:  Allergies  Allergen Reactions   Codeine Nausea Only   Fluconazole Other (See Comments)    caused brown spots to break out on her arms    Family History: Family History  Problem Relation Age of Onset   Interstitial cystitis Daughter    Kidney disease Father    Hypertension Father    Hyperlipidemia Father    Brain cancer Mother    Gout Sister    Osteoarthritis Sister    Prostate cancer Neg Hx    Kidney cancer Neg Hx    Bladder Cancer Neg Hx    Breast cancer Neg Hx      Social History:  reports that she has never smoked. She has never used smokeless tobacco. She reports that she does not drink alcohol and does not use drugs.  ROS: Pertinent ROS in HPI  Physical Exam: BP 119/82   Pulse 89   Ht 5' 2 (1.575 m)   Wt 175 lb (79.4 kg)   BMI 32.01 kg/m   Constitutional:  Well nourished. Alert and oriented, No acute distress. HEENT: [place] AT, moist mucus membranes.  Trachea midline Cardiovascular: No clubbing, cyanosis, or edema. Respiratory: Normal respiratory effort, no increased work of breathing. Neurologic: Grossly intact, no focal deficits, moving all 4 extremities. Psychiatric: Normal  mood and affect.    Laboratory Data: See EPIC and HPI I have reviewed the labs.  See HPI.      Pertinent Imaging:  05/25/24 08:59  Scan Result 0ml    Assessment & Plan:    1. OAB -Stable symptoms  2.  Vaginal atrophy -not bothersome  3.  Microscopic hematuria -UA explained that she has persistent microscopic hematuria and that we need to pursue further evaluation to rule out any concerning pathology - urine culture sent  -She is concerned, because she is not experiencing any symptoms -will go ahead and order a renal ultrasound at this time for further evaluation    Return in about 3 weeks (around 06/15/2024) for RUS report .  These notes generated with voice recognition software. I apologize for typographical errors.  Susan Harding  [facility] [address]  [address] [place], [place] [address] [phone]

## 2024-05-25 ENCOUNTER — Ambulatory Visit (INDEPENDENT_AMBULATORY_CARE_PROVIDER_SITE_OTHER): Payer: Self-pay | Admitting: Urology

## 2024-05-25 ENCOUNTER — Encounter: Payer: Self-pay | Admitting: Urology

## 2024-05-25 VITALS — BP 119/82 | HR 89 | Ht 62.0 in | Wt 175.0 lb

## 2024-05-25 DIAGNOSIS — R3129 Other microscopic hematuria: Secondary | ICD-10-CM | POA: Diagnosis not present

## 2024-05-25 DIAGNOSIS — N3281 Overactive bladder: Secondary | ICD-10-CM | POA: Diagnosis not present

## 2024-05-25 DIAGNOSIS — N952 Postmenopausal atrophic vaginitis: Secondary | ICD-10-CM

## 2024-05-25 LAB — BLADDER SCAN AMB NON-IMAGING

## 2024-05-25 NOTE — Patient Instructions (Signed)
 Scheduling number: [phone]

## 2024-05-27 ENCOUNTER — Ambulatory Visit
Admission: RE | Admit: 2024-05-27 | Discharge: 2024-05-27 | Disposition: A | Discharge status: Home / Self Care | Source: Ambulatory Visit | Attending: Urology | Admitting: Urology

## 2024-05-27 ENCOUNTER — Ambulatory Visit: Payer: Self-pay | Admitting: Urology

## 2024-05-27 DIAGNOSIS — R3129 Other microscopic hematuria: Secondary | ICD-10-CM

## 2024-05-27 LAB — CULTURE, URINE COMPREHENSIVE

## 2024-05-27 MED ORDER — NITROFURANTOIN MONOHYD MACRO 100 MG PO CAPS: 100.0000 mg | ORAL_CAPSULE | Freq: Two times a day (BID) | ORAL | 0 refills | Status: AC

## 2024-06-21 NOTE — Progress Notes (Signed)
 06/22/2024 5:01 PM   Susan  W Harding [birth year] [id number]  Referring provider: Alla Amis, MD [phone] [facility] [address] [facility] [place],  [place] [address]  Urological history: 1. 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 rUTI's -contributing factors of age, vaginal atrophy and DM -vaginal estrogen cream   3. OAB -contributing factors of age, vaginal atrophy and DM -Oxybutynin  IR 5 mg daily  Chief Complaint  Patient presents with   Follow-up   HPI: Susan Harding is a 73 y.o. female who presents today for a renal ultrasound report that was performed for further evaluation of her microscopic hematuria with her husband, Susan Harding.   Previous records reviewed.     At her appointment on 02/282/2025, 1 month ago, she had dysuria, frequency and urgency.  She went to a walk-in clinic and was found to have an infected looking urinalysis and she was put on Omnicef 300 mg twice daily for 7 days.  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  Her symptoms have lessened, but not have completely abated.  She is having 8 or more daytime voids, 1-2 episodes of nocturia and a mild urge to urinate.  She has stress urinary continence.  She leaks 3 or more times a week.  She does not wear absorbent products.  She does not limit fluid intake.  She does engage in toilet mapping.  Patient denies any modifying or aggravating factors.  Patient denies any gross hematuria or suprapubic/flank pain.  Patient denies any fevers, chills, nausea or vomiting.  UA yellow clear, specific gravity 1.015, trace heme, pH 7.0, 0-5 WBCs, 3-10 RBCs, 0-10 epithelial cells and a few bacteria.  PVR 86 mL.  Urine culture was negative and it was recommended that she have an x-ray which was not completed.  At her visit on May 25, 2024, she is having no symptoms today.  Patient denies any modifying or  aggravating factors.  Patient denies any recent UTI's, gross hematuria, dysuria or suprapubic/flank pain.  Patient denies any fevers, chills, nausea or vomiting.  UA with leuks, microscopic hematuria and bacteriuria.   PVR 0 mL.  Serum creatinine (05/2024) 0.5, eGFR 98.  Hemoglobin A1c (05/2024) 6.0.   Urine culture grew out MDRO E. coli.  Renal ultrasound was completed on May 27, 2024 and there was no hydronephrosis, masses or stones identified.  Today's urinalysis is positive for nitrites, pyuria, hematuria and many bacteria.   When we discussed whether or not she has any symptoms, she states she may have some mild dysuria.  Patient denies any modifying or aggravating factors.  Patient denies any gross hematuria or suprapubic/flank pain.  Patient denies any fevers, chills, nausea or vomiting.    PMH: Past Medical History:  Diagnosis Date   Arthritis    Atrophic vaginitis    Erosive gastritis    Frequency    Fundic gland polyps of stomach, benign    GERD (gastroesophageal reflux disease)    History of bladder infections    History of recurrent UTIs    Meniere disease    Meniere's disease    Nystagmus    OAB (overactive bladder)    PONV (postoperative nausea and vomiting)    Pre-diabetes    Pure hypercholesterolemia    Stress incontinence    Thyroid disease    Vertigo    Vitamin D deficiency     Surgical History: Past Surgical  History:  Procedure Laterality Date   ABDOMINAL HYSTERECTOMY  2002   BREAST BIOPSY Right 2009   neg   BREAST EXCISIONAL BIOPSY Right 1970   neg   ESOPHAGOGASTRODUODENOSCOPY N/A 11/16/2015   Procedure: ESOPHAGOGASTRODUODENOSCOPY (EGD);  Surgeon: Gladis RAYMOND Mariner, MD;  Location: [facility] ENDOSCOPY;  Service: Endoscopy;  Laterality: N/A;   ESOPHAGOGASTRODUODENOSCOPY (EGD) WITH PROPOFOL  N/A 08/18/2019   Procedure: ESOPHAGOGASTRODUODENOSCOPY (EGD) WITH PROPOFOL ;  Surgeon: Mariner Gladis RAYMOND, MD;  Location: [facility] ENDOSCOPY;  Service: Endoscopy;  Laterality: N/A;    HIP ARTHROPLASTY  2018   JOINT REPLACEMENT  06/2013   hip   KNEE ARTHROSCOPY Left 12/10/2022   Procedure: ARTHROSCOPY KNEE WITH DEBRIDEMENT AND REPAIR VERSUS PARTIAL MEDIAL MENISCECTOMY.;  Surgeon: Edie Norleen PARAS, MD;  Location: [facility] ORS;  Service: Orthopedics;  Laterality: Left;   maxillary sinusotomy intranasal     TONSILLECTOMY     TUBAL LIGATION      Home Medications:  Allergies as of 06/22/2024       Reactions   Codeine Nausea Only   Fluconazole Other (See Comments)   caused brown spots to break out on her arms        Medication List        Accurate as of June 22, 2024 11:59 PM. If you have any questions, ask your nurse or doctor.          STOP taking these medications    celecoxib 100 MG capsule Commonly known as: CELEBREX   Gemtesa  75 MG Tabs Generic drug: Vibegron        TAKE these medications    atorvastatin 20 MG tablet Commonly known as: LIPITOR Take 20 mg by mouth at bedtime.   estradiol  0.1 MG/GM vaginal cream Commonly known as: ESTRACE  VAGINAL Apply 0.5mg  (pea-sized amount)  just inside the vaginal introitus with a finger-tip every night for two weeks and then Monday, Wednesday and Friday nights.   levothyroxine 112 MCG tablet Commonly known as: SYNTHROID Take 112 mcg by mouth daily before breakfast.   metFORMIN 500 MG 24 hr tablet Commonly known as: GLUCOPHAGE-XR Take 500 mg by mouth.   Multi-Vitamins Tabs Take 1 tablet by mouth daily.   nitrofurantoin  (macrocrystal-monohydrate) 100 MG capsule Commonly known as: MACROBID  Take 1 capsule (100 mg total) by mouth every 12 (twelve) hours.   OMEGA 3-6-9 FATTY ACIDS PO Take 1 capsule by mouth daily.   oxybutynin  5 MG tablet Commonly known as: DITROPAN  TAKE ONE (1) TABLET BY MOUTH TWO TIMES PER DAY   PRESERVISION AREDS 2 PO Take 1 tablet by mouth daily.   triamterene-hydrochlorothiazide 37.5-25 MG tablet Commonly known as: MAXZIDE-25 Take 1 tablet by mouth daily.         Allergies:  Allergies  Allergen Reactions   Codeine Nausea Only   Fluconazole Other (See Comments)    caused brown spots to break out on her arms    Family History: Family History  Problem Relation Age of Onset   Interstitial cystitis Daughter    Kidney disease Father    Hypertension Father    Hyperlipidemia Father    Brain cancer Mother    Gout Sister    Osteoarthritis Sister    Prostate cancer Neg Hx    Kidney cancer Neg Hx    Bladder Cancer Neg Hx    Breast cancer Neg Hx     Social History:  reports that she has never smoked. She has never used smokeless tobacco. She reports that she does not drink alcohol and does not  use drugs.  ROS: Pertinent ROS in HPI  Physical Exam: BP 113/74 (BP Location: Left Arm, Patient Position: Sitting, Cuff Size: Large)   Pulse 69   Ht 5' 3 (1.6 m)   Wt 173 lb (78.5 kg)   SpO2 97%   BMI 30.65 kg/m   Constitutional:  Well nourished. Alert and oriented, No acute distress. HEENT: [place] AT, moist mucus membranes.  Trachea midline Cardiovascular: No clubbing, cyanosis, or edema. Respiratory: Normal respiratory effort, no increased work of breathing. Neurologic: Grossly intact, no focal deficits, moving all 4 extremities. Psychiatric: Normal mood and affect.    Laboratory Data: See EPIC and HPI I have reviewed the labs.  See HPI.      Pertinent Imaging: Narrative & Impression  EXAM: RETROPERITONEAL ULTRASOUND OF THE KIDNEYS AND URINARY BLADDER   TECHNIQUE: Real-time ultrasonography of the retroperitoneum including the kidneys and urinary bladder was performed.   COMPARISON: CT 01/16/17   CLINICAL HISTORY: Microscopic hematuria.   FINDINGS:   RIGHT KIDNEY: The right kidney measures 11.1 x 4.4 x 5.1 cm for a volume of 130 cc. Parenchymal echotexture is normal. No hydronephrosis. No visible stone or focal lesion. The tiny cysts on prior CT are not demonstrated by ultrasound.   LEFT KIDNEY: The left kidney measures  11.8 x 5.6 x 4.6 cm for a volume of 157.1 cc. Parenchymal echotexture is normal. No hydronephrosis. No visible stone or focal lesion. The tiny cysts on prior CT are not demonstrated by ultrasound.   BLADDER: Unremarkable appearance of the bladder.   IMPRESSION: 1. Unremarkable renal ultrasound.   Electronically signed by: Andrea Gasman MD 06/03/2024 02:23 PM EDT RP Workstation: [id number]  I have independently reviewed the films.  See HPI.     Assessment & Plan:    1. OAB -Stable symptoms  2.  Vaginal atrophy -not bothersome  3.  Microscopic hematuria -UA still with micro heme  -renal ultrasound w/o any worrisome findings -recommend cystoscopy secondary to persistent microscopic hematuria  -Explained that the cystoscopy is a safe and common diagnostic test performed by one of our physicians in the office.  It consist of using a thin, lighted tube to look directly inside the bladder and urethra to evaluate the anatomy.  The procedure is brief, typically taking about 5 minutes. -This will enable us  to assess bladder health and rule out other bladder conditions (stricture disease, stones, cancer, etc.)  -Advised the patient that there are no restrictions to eating or drinking prior to the cystoscopy -They can continue to take all of their medications as prescribed -They can drive themselves to and from the appointment -I explained that during the procedure, the area around the urethra will be cleansed thoroughly, topical anesthetic will be applied to numb your urethra, the thin tube is then gently inserted through the urethra into your bladder while fluid flows through the tube to the bladder to enable better visualization -I explained the procedure is usually not painful, however there may be some discomfort (pinching feeling), and they may feel an urge to urinate, coolness or fullness in the bladder and then the cystoscope is removed -After the cystoscopy, I advised them that they  may experience urinary frequency, hematuria, dysuria which will resolve within 24 to 48 hours -Reviewed red flag signs (fever, bright red blood or blood clots in the urine, abdominal pain or difficulty urinating) and to contact the office immediately or seek treatment in the ED if they should experience any of these -The physician will discuss the  results of the cystoscopy at the time of the procedure  -we discussed that she may be chronically colonized and the micro heme is coming from chronic inflammation  Return for cysto for persistent micro heme/pyuria .  These notes generated with voice recognition software. I apologize for typographical errors.  Susan Harding  [facility] [address]  [address] [place], [place] [address] [phone]

## 2024-06-22 ENCOUNTER — Ambulatory Visit (INDEPENDENT_AMBULATORY_CARE_PROVIDER_SITE_OTHER): Admitting: Urology

## 2024-06-22 VITALS — BP 113/74 | HR 69 | Ht 63.0 in | Wt 173.0 lb

## 2024-06-22 DIAGNOSIS — N3281 Overactive bladder: Secondary | ICD-10-CM

## 2024-06-22 DIAGNOSIS — N952 Postmenopausal atrophic vaginitis: Secondary | ICD-10-CM | POA: Diagnosis not present

## 2024-06-22 DIAGNOSIS — R3129 Other microscopic hematuria: Secondary | ICD-10-CM | POA: Diagnosis not present

## 2024-06-22 LAB — URINALYSIS, COMPLETE
Bilirubin, UA: NEGATIVE
Glucose, UA: NEGATIVE
Ketones, UA: NEGATIVE
Nitrite, UA: POSITIVE — AB
Protein,UA: NEGATIVE
Specific Gravity, UA: 1.015 (ref 1.005–1.030)
Urobilinogen, Ur: 0.2 mg/dL (ref 0.2–1.0)
pH, UA: 7.5 (ref 5.0–7.5)

## 2024-06-22 LAB — MICROSCOPIC EXAMINATION: WBC, UA: >30 /HPF — AB (ref 0–5)

## 2024-06-22 NOTE — Patient Instructions (Signed)

## 2024-06-26 ENCOUNTER — Encounter: Payer: Self-pay | Admitting: Urology

## 2024-06-27 ENCOUNTER — Ambulatory Visit: Payer: Self-pay | Admitting: Urology

## 2024-06-27 LAB — CULTURE, URINE COMPREHENSIVE

## 2024-06-29 ENCOUNTER — Other Ambulatory Visit: Payer: Self-pay

## 2024-06-29 MED ORDER — NITROFURANTOIN MONOHYD MACRO 100 MG PO CAPS: 100.0000 mg | ORAL_CAPSULE | Freq: Two times a day (BID) | ORAL | 0 refills | Status: AC

## 2024-07-27 ENCOUNTER — Ambulatory Visit: Admitting: Urology

## 2024-07-27 VITALS — BP 151/78 | HR 76 | Ht 62.0 in | Wt 176.0 lb

## 2024-07-27 DIAGNOSIS — R3129 Other microscopic hematuria: Secondary | ICD-10-CM

## 2024-07-27 LAB — MICROSCOPIC EXAMINATION: WBC, UA: >30 /HPF — AB (ref 0–5)

## 2024-07-27 LAB — URINALYSIS, COMPLETE
Bilirubin, UA: NEGATIVE
Glucose, UA: NEGATIVE
Ketones, UA: NEGATIVE
Nitrite, UA: NEGATIVE
Protein,UA: NEGATIVE
RBC, UA: NEGATIVE
Specific Gravity, UA: 1.01 (ref 1.005–1.030)
Urobilinogen, Ur: 0.2 mg/dL (ref 0.2–1.0)
pH, UA: 8 — ABNORMAL HIGH (ref 5.0–7.5)

## 2024-07-27 NOTE — Progress Notes (Signed)
   07/27/24  CC: No chief complaint on file.   HPI: Refer to Clotilda McGowan's note 06/22/2024.  UA today  See rooming tab for vitals NED. A&Ox3.   No respiratory distress   Abd soft, NT, ND Normal external genitalia with patent urethral meatus  Cystoscopy Procedure Note  Patient identification was confirmed, informed consent was obtained, and patient was prepped using Betadine solution.  Lidocaine  jelly was administered per urethral meatus.    Procedure: - Flexible cystoscope introduced, without any difficulty.   - Thorough search of the bladder revealed:    normal urethral meatus    normal urothelium    no stones    no ulcers     no tumors    no urethral polyps    no trabeculation  - Ureteral orifices were normal in position and appearance.  Post-Procedure: - Patient tolerated the procedure well  Assessment/ Plan: No bladder mucosal abnormalities Keep scheduled follow-up with Clotilda Helon Glendia JAYSON Twylla, MD

## 2024-07-30 LAB — CULTURE, URINE COMPREHENSIVE

## 2024-07-31 ENCOUNTER — Ambulatory Visit: Payer: Self-pay | Admitting: Urology

## 2024-11-08 LAB — URINALYSIS, COMPLETE
Bilirubin, UA: NEGATIVE
Glucose, UA: NEGATIVE
Ketones, UA: NEGATIVE
Nitrite, UA: NEGATIVE
Protein,UA: NEGATIVE
Specific Gravity, UA: 1.02 (ref 1.005–1.030)
Urobilinogen, Ur: 0.2 mg/dL (ref 0.2–1.0)
pH, UA: 6 (ref 5.0–7.5)

## 2024-11-08 LAB — MICROSCOPIC EXAMINATION

## 2025-07-27 ENCOUNTER — Ambulatory Visit: Admitting: Urology
